# Patient Record
Sex: Female | Born: 1975 | Race: White | Hispanic: No | Marital: Married | Smoking: Never smoker
Health system: Southern US, Community
[De-identification: ages and names within clinical notes are randomized; demographics above are authoritative.]

## PROBLEM LIST (undated history)

## (undated) DIAGNOSIS — R609 Edema, unspecified: Secondary | ICD-10-CM

## (undated) DIAGNOSIS — G43909 Migraine, unspecified, not intractable, without status migrainosus: Secondary | ICD-10-CM

## (undated) HISTORY — PX: ABDOMINAL HYSTERECTOMY: SHX81

---

## 2017-05-14 ENCOUNTER — Emergency Department
Admission: EM | Admit: 2017-05-14 | Discharge: 2017-05-14 | Disposition: A | Discharge status: Home / Self Care | Payer: Medicaid Other | Source: Home / Self Care | Attending: Family Medicine | Admitting: Family Medicine

## 2017-05-14 ENCOUNTER — Encounter: Payer: Self-pay | Admitting: Emergency Medicine

## 2017-05-14 DIAGNOSIS — R197 Diarrhea, unspecified: Secondary | ICD-10-CM

## 2017-05-14 DIAGNOSIS — R110 Nausea: Secondary | ICD-10-CM

## 2017-05-14 LAB — POCT CBC W AUTO DIFF (K'VILLE URGENT CARE)

## 2017-05-14 MED ORDER — ONDANSETRON 4 MG PO TBDP
4.0000 mg | ORAL_TABLET | Freq: Once | ORAL | Status: AC
  Administered 2017-05-14: 4 mg via ORAL

## 2017-05-14 MED ORDER — ONDANSETRON 4 MG PO TBDP: ORAL_TABLET | ORAL | 0 refills | Status: DC

## 2017-05-14 NOTE — Discharge Instructions (Signed)

## 2017-05-14 NOTE — ED Triage Notes (Signed)
Diarrhea x 3.5 days, fever

## 2017-05-14 NOTE — ED Provider Notes (Signed)
[facility]    CSN: [id number] Arrival date & time: 05/14/17  1013     History   Chief Complaint Chief Complaint  Patient presents with  . Diarrhea    HPI Kathleen Goodman is a 40 y.o. female.   Four days ago patient developed diarrhea, abdominal cramping, and nausea without vomiting.  She had low grade fever two days ago.  Denies recent foreign travel, or drinking untreated water in a wilderness environment.  She denies recent antibiotic use.    The history is provided by the patient.  Diarrhea  Quality:  Watery Severity:  Mild Onset quality:  Sudden Duration:  4 days Timing:  Intermittent Progression:  Unchanged Relieved by:  Nothing Exacerbated by: food. Ineffective treatments:  None tried Associated symptoms: chills and fever   Associated symptoms: no abdominal pain, no arthralgias, no recent cough, no headaches, no myalgias, no URI and no vomiting   Risk factors: no recent antibiotic use, no sick contacts, no suspicious food intake and no travel to endemic areas     History reviewed. No pertinent past medical history.  There are no active problems to display for this patient.   Past Surgical History:  Procedure Laterality Date  . ABDOMINAL HYSTERECTOMY      OB History    No data available       Home Medications    Prior to Admission medications   Medication Sig Start Date End Date Taking? Authorizing Provider  furosemide (LASIX) 20 MG tablet Take 20 mg by mouth.   Yes [provider]  ondansetron (ZOFRAN ODT) 4 MG disintegrating tablet Take one tab by mouth Q6hr prn nausea.  Dissolve under tongue. 05/14/17   Lattie Haw, MD    Family History Family History  Problem Relation Age of Onset  . Hypertension Mother   . Diabetes Mother   . Hypertension Father   . Diabetes Father     Social History Social History  Substance Use Topics  . Smoking status: Never Smoker  . Smokeless tobacco: Never Used  . Alcohol use No      Allergies   Patient has no known allergies.   Review of Systems Review of Systems  Constitutional: Positive for chills and fever.  Gastrointestinal: Positive for diarrhea. Negative for abdominal pain and vomiting.  Musculoskeletal: Negative for arthralgias and myalgias.  Neurological: Negative for headaches.  All other systems reviewed and are negative.    Physical Exam Triage Vital Signs ED Triage Vitals  Enc Vitals Group     BP 05/14/17 1038 131/85     Pulse Rate 05/14/17 1038 (!) 117     Resp --      Temp 05/14/17 1038 99.1 F (37.3 C)     Temp Source 05/14/17 1038 Oral     SpO2 05/14/17 1038 98 %     Weight 05/14/17 1039 192 lb (87.1 kg)     Height 05/14/17 1039 5\' 5"  (1.651 m)     Head Circumference --      Peak Flow --      Pain Score 05/14/17 1039 3     Pain Loc --      Pain Edu? --      Excl. in GC? --    No data found.   Updated Vital Signs BP 131/85 (BP Location: Left Arm)   Pulse (!) 117   Temp 99.1 F (37.3 C) (Oral)   Ht 5\' 5"  (1.651 m)   Wt 192 lb (87.1 kg)  SpO2 98%   BMI 31.95 kg/m   Visual Acuity Right Eye Distance:   Left Eye Distance:   Bilateral Distance:    Right Eye Near:   Left Eye Near:    Bilateral Near:     Physical Exam Nursing notes and Vital Signs reviewed. Appearance:  Patient appears stated age, and in no acute distress Eyes:  Pupils are equal, round, and reactive to light and accomodation.  Extraocular movement is intact.  Conjunctivae are not inflamed  Ears:  Canals normal.  Tympanic membranes normal.  Nose:   Normal turbinates.  No sinus tenderness.    Mouth/Pharynx:  Normal; moist mucous membranes  Neck:  Supple.  No adenopathy Lungs:  Clear to auscultation.  Breath sounds are equal.  Moving air well. Heart:  Regular rate and rhythm without murmurs, rubs, or gallops.  Abdomen:  Nontender without masses or hepatosplenomegaly.  Bowel sounds are present.  No CVA or flank tenderness.  Extremities:  No edema.   Skin:  No rash present.    UC Treatments / Results  Labs (all labs ordered are listed, but only abnormal results are displayed) Labs Reviewed -   WBC 9.3; LY 20.7; MO 1.8; GR 77.5; Hgb 15.2; Platelets 327    EKG  EKG Interpretation None       Radiology No results found.  Procedures Procedures (including critical care time)  Medications Ordered in UC Medications  ondansetron (ZOFRAN-ODT) disintegrating tablet 4 mg (not administered)     Initial Impression / Assessment and Plan / UC Course  I have reviewed the triage vital signs and the nursing notes.  Pertinent labs & imaging results that were available during my care of the patient were reviewed by me and considered in my medical decision making (see chart for details).    Normal WBC reassuring. Suspect viral gastroenteritis. Administered Zofran ODT 4mg  PO;  Given Rx for same. Begin clear liquids (Pedialyte while having diarrhea) until improved, then advance to a SU[employer]BR[employer] diet (Bananas, Rice, Applesauce, Toast).  Then gradually resume a regular diet when tolerated.  Avoid milk products until well.  To decrease diarrhea, mix one teaspoon Citrucel (methylcellulose) in 2 oz water and drink one to three times daily.  Do not drink extra fluids with this dose and do not drink fluids for one hour afterwards.  When stools become more formed, may take Imodium (loperamide) once or twice daily to decrease stool frequency.  If symptoms become significantly worse during the night or over the weekend, proceed to the local emergency room.     Final Clinical Impressions(s) / UC Diagnoses   Final diagnoses:  Nausea  Diarrhea of presumed infectious origin    New Prescriptions New Prescriptions   ONDANSETRON (ZOFRAN ODT) 4 MG DISINTEGRATING TABLET    Take one tab by mouth Q6hr prn nausea.  Dissolve under tongue.     Lattie HawBeese, Laird Runnion A, MD 05/23/17 [phone]

## 2017-05-23 ENCOUNTER — Telehealth (INDEPENDENT_AMBULATORY_CARE_PROVIDER_SITE_OTHER): Payer: Medicaid Other | Admitting: Family Medicine

## 2017-05-23 DIAGNOSIS — R110 Nausea: Secondary | ICD-10-CM

## 2017-05-23 DIAGNOSIS — R197 Diarrhea, unspecified: Secondary | ICD-10-CM

## 2017-05-23 NOTE — Telephone Encounter (Signed)
ENCOUNTER CREATED TO ADD CBC ORDER NOT ENTERED ON DOS 05/14/17.

## 2018-04-27 ENCOUNTER — Other Ambulatory Visit: Payer: Self-pay

## 2018-04-27 ENCOUNTER — Encounter: Payer: Self-pay | Admitting: Emergency Medicine

## 2018-04-27 ENCOUNTER — Emergency Department
Admission: EM | Admit: 2018-04-27 | Discharge: 2018-04-27 | Disposition: A | Discharge status: Home / Self Care | Payer: Medicaid Other | Source: Home / Self Care

## 2018-04-27 DIAGNOSIS — R21 Rash and other nonspecific skin eruption: Secondary | ICD-10-CM

## 2018-04-27 MED ORDER — PREDNISONE 10 MG PO TABS: ORAL_TABLET | ORAL | 0 refills | Status: DC

## 2018-04-27 NOTE — ED Triage Notes (Signed)
Pt c/o of rash that started last Wednesday. States it started on her abdomen and has spread to her legs and neck. There is redness and itching.

## 2018-04-27 NOTE — Discharge Instructions (Addendum)
Return if any problems.  See your Physician for recheck in 3-4 days  ?

## 2018-04-28 NOTE — ED Provider Notes (Signed)
[facility]    CSN: [id number] Arrival date & time: 04/27/18  1229     History   Chief Complaint Chief Complaint  Patient presents with  . Rash    HPI Kathleen Goodman is a 42 y.o. female.   The history is provided by the patient. No language interpreter was used.  Rash  Location:  Full body Quality: itchiness and redness   Severity:  Moderate Onset quality:  Gradual Duration:  1 week Timing:  Constant Progression:  Worsening Chronicity:  New Context: animal contact   Relieved by:  None tried Worsened by:  Nothing Associated symptoms: no fever, no headaches, no joint pain, no myalgias and no sore throat     History reviewed. No pertinent past medical history.  There are no active problems to display for this patient.   Past Surgical History:  Procedure Laterality Date  . ABDOMINAL HYSTERECTOMY      OB History   None      Home Medications    Prior to Admission medications   Medication Sig Start Date End Date Taking? Authorizing Provider  furosemide (LASIX) 20 MG tablet Take 20 mg by mouth.    [provider]  ondansetron (ZOFRAN ODT) 4 MG disintegrating tablet Take one tab by mouth Q6hr prn nausea.  Dissolve under tongue. 05/14/17   Lattie Haw, MD  predniSONE (DELTASONE) 10 MG tablet 6,5,4,3,2,1 taper 04/27/18   Kathleen Areas, PA-C    Family History Family History  Problem Relation Age of Onset  . Hypertension Mother   . Diabetes Mother   . Hypertension Father   . Diabetes Father     Social History Social History   Tobacco Use  . Smoking status: Never Smoker  . Smokeless tobacco: Never Used  Substance Use Topics  . Alcohol use: No  . Drug use: No     Allergies   Patient has no known allergies.   Review of Systems Review of Systems  Constitutional: Negative for fever.  HENT: Negative for sore throat.   Musculoskeletal: Negative for arthralgias and myalgias.  Skin: Positive for rash.  Neurological:  Negative for headaches.  All other systems reviewed and are negative.    Physical Exam Triage Vital Signs ED Triage Vitals  Enc Vitals Group     BP 04/27/18 1253 (!) 136/95     Pulse Rate 04/27/18 1253 (!) 103     Resp --      Temp 04/27/18 1253 98.3 F (36.8 C)     Temp Source 04/27/18 1253 Oral     SpO2 --      Weight 04/27/18 1254 211 lb (95.7 kg)     Height 04/27/18 1254  (1.651 m)     Head Circumference --      Peak Flow --      Pain Score 04/27/18 1254 0     Pain Loc --      Pain Edu? --      Excl. in GC? --    No data found.  Updated Vital Signs BP (!) 136/95 (BP Location: Right Arm)   Pulse (!) 103   Temp 98.3 F (36.8 C) (Oral)   Ht  (1.651 m)   Wt 211 lb (95.7 kg)   BMI 35.11 kg/m   Visual Acuity Right Eye Distance:   Left Eye Distance:   Bilateral Distance:    Right Eye Near:   Left Eye Near:    Bilateral Near:  Physical Exam  Constitutional: She is oriented to person, place, and time. She appears well-developed and well-nourished.  HENT:  Head: Normocephalic.  Eyes: EOM are normal.  Neck: Normal range of motion.  Cardiovascular: Normal rate.  Pulmonary/Chest: Effort normal.  Abdominal: She exhibits no distension.  Musculoskeletal: Normal range of motion.  Neurological: She is alert and oriented to person, place, and time.  Skin:  Red raised Goodman, looks like bites,   Psychiatric: She has a normal mood and affect.  Nursing note and vitals reviewed.    UC Treatments / Results  Labs (all labs ordered are listed, but only abnormal results are displayed) Labs Reviewed - No data to display  EKG None  Radiology No results found.  Procedures Procedures (including critical care time)  Medications Ordered in UC Medications - No data to display  Initial Impression / Assessment and Plan / UC Course  I have reviewed the triage vital signs and the nursing notes.  Pertinent labs & imaging results that were available during  my care of the patient were reviewed by me and considered in my medical decision making (see chart for details).     MDM  Pt advised to check for bedbugs.   Possible chiggers.   I will treat with prednisone  Final Clinical Impressions(s) / UC Diagnoses   Final diagnoses:  Rash     Discharge Instructions     Return if any problems.  See your Physician for recheck in 3-4 days   ED Prescriptions    Medication Sig Dispense Auth. Provider   predniSONE (DELTASONE) 10 MG tablet 6,5,4,3,2,1 taper 21 tablet Kathleen Goodman, [place]     Controlled Substance Prescriptions [place] Controlled Substance Registry consulted? Not Applicable   Kathleen Goodman, [place] 04/28/18 1002

## 2018-10-16 ENCOUNTER — Other Ambulatory Visit: Payer: Self-pay

## 2018-10-16 ENCOUNTER — Emergency Department
Admission: EM | Admit: 2018-10-16 | Discharge: 2018-10-16 | Disposition: A | Discharge status: Home / Self Care | Payer: Self-pay | Source: Home / Self Care | Attending: Family Medicine | Admitting: Family Medicine

## 2018-10-16 DIAGNOSIS — H8111 Benign paroxysmal vertigo, right ear: Secondary | ICD-10-CM

## 2018-10-16 MED ORDER — MECLIZINE HCL 25 MG PO TABS: 25.0000 mg | ORAL_TABLET | Freq: Three times a day (TID) | ORAL | 0 refills | Status: DC | PRN

## 2018-10-16 NOTE — ED Provider Notes (Signed)
[facility]    CSN: [id number] Arrival date & time: 10/16/18  4098     History   Chief Complaint Chief Complaint  Patient presents with  . Dizziness    sudden onset    HPI Kathleen Goodman is a 42 y.o. female.   HPI Kathleen Goodman is a 42 y.o. female presenting to UC with c/o sudden onset dizziness, had to hold onto the wall while getting out of bed to walk to the bathroom. Associated nausea. The nausea has eased up but the dizziness has persisted.  No prior hx of vertigo. Hx of migraines but denies HA today. Denies recent illness, cough, congestion, vomiting or diarrhea. No increased stress.    History reviewed. No pertinent past medical history.  There are no active problems to display for this patient.   Past Surgical History:  Procedure Laterality Date  . ABDOMINAL HYSTERECTOMY      OB History   None      Home Medications    Prior to Admission medications   Medication Sig Start Date End Date Taking? Authorizing Provider  furosemide (LASIX) 20 MG tablet Take 20 mg by mouth.    [provider]  meclizine (ANTIVERT) 25 MG tablet Take 1 tablet (25 mg total) by mouth 3 (three) times daily as needed for dizziness. 10/16/18   Lurene Shadow, PA-C  ondansetron (ZOFRAN ODT) 4 MG disintegrating tablet Take one tab by mouth Q6hr prn nausea.  Dissolve under tongue. 05/14/17   Lattie Haw, MD  predniSONE (DELTASONE) 10 MG tablet 6,5,4,3,2,1 taper 04/27/18   Elson Areas, PA-C    Family History Family History  Problem Relation Age of Onset  . Hypertension Mother   . Diabetes Mother   . Hypertension Father   . Diabetes Father     Social History Social History   Tobacco Use  . Smoking status: Never Smoker  . Smokeless tobacco: Never Used  Substance Use Topics  . Alcohol use: No  . Drug use: No     Allergies   Codeine   Review of Systems Review of Systems  Constitutional: Negative for chills and fever.  Cardiovascular:  Negative for chest pain, palpitations and leg swelling.  Gastrointestinal: Positive for nausea. Negative for vomiting.  Musculoskeletal: Negative for arthralgias, neck pain and neck stiffness.  Neurological: Positive for dizziness. Negative for speech difficulty, weakness, light-headedness, numbness and headaches.     Physical Exam Triage Vital Signs ED Triage Vitals [10/16/18 0955]  Enc Vitals Group     BP (!) 135/95     Pulse Rate 80     Resp      Temp (!) 97.4 F (36.3 C)     Temp Source Oral     SpO2 98 %     Weight 192 lb (87.1 kg)     Height 5\' 6"  (1.676 m)     Head Circumference      Peak Flow      Pain Score 0     Pain Loc      Pain Edu?      Excl. in GC?    No data found.  Updated Vital Signs BP (!) 135/95 (BP Location: Right Arm)   Pulse 80   Temp (!) 97.4 F (36.3 C) (Oral)   Ht 5\' 6"  (1.676 m)   Wt 192 lb (87.1 kg)   SpO2 98%   BMI 30.99 kg/m   Visual Acuity Right Eye Distance:   Left Eye Distance:  Bilateral Distance:    Right Eye Near:   Left Eye Near:    Bilateral Near:     Physical Exam  Constitutional: She is oriented to person, place, and time. She appears well-developed and well-nourished.  HENT:  Head: Normocephalic and atraumatic.  Right Ear: Tympanic membrane normal.  Left Ear: Tympanic membrane normal.  Nose: Nose normal. Right sinus exhibits no maxillary sinus tenderness and no frontal sinus tenderness. Left sinus exhibits no maxillary sinus tenderness and no frontal sinus tenderness.  Mouth/Throat: Uvula is midline, oropharynx is clear and moist and mucous membranes are normal.  Eyes: Pupils are equal, round, and reactive to light. Conjunctivae are normal. Right eye exhibits no discharge. Left eye exhibits no discharge. Right eye exhibits nystagmus. Left eye exhibits nystagmus.  Neck: Normal range of motion. Neck supple.  Cardiovascular: Normal rate and regular rhythm.  Pulmonary/Chest: Effort normal and breath sounds normal. No  respiratory distress.  Musculoskeletal: Normal range of motion.  Neurological: She is alert and oriented to person, place, and time.  Speech is clear, face is symmetric. Alert to person, place and time. Normal finger to nose coordination. Normal gait.   Skin: Skin is warm and dry. Capillary refill takes less than 2 seconds.  Psychiatric: She has a normal mood and affect. Her behavior is normal.  Nursing note and vitals reviewed.    UC Treatments / Results  Labs (all labs ordered are listed, but only abnormal results are displayed) Labs Reviewed - No data to display  EKG None  Radiology No results found.  Procedures Procedures (including critical care time)  Medications Ordered in UC Medications - No data to display  Initial Impression / Assessment and Plan / UC Course  I have reviewed the triage vital signs and the nursing notes.  Pertinent labs & imaging results that were available during my care of the patient were reviewed by me and considered in my medical decision making (see chart for details).     Normal neuro exam besides horizontal nystagmus to the Right. Will tx for vertigo  Home care instructions provided. Encouraged f/u with PCP. Discussed symptoms that warrant emergent care in the ED.  Final Clinical Impressions(s) / UC Diagnoses   Final diagnoses:  BPPV (benign paroxysmal positional vertigo), right     Discharge Instructions      Antivert (meclizine) is a medication to help with dizziness and nausea related to vertigo.  This medication can cause drowsiness. Do not operate heavy machinery or drive while taking.   Please follow up with family medicine in 2-3 days if not improving.  Call 911 or have someone drive you to the closest hospital if symptoms worsening/new symptoms develop- increased dizziness despite taking the medication, headache, change in vision, vomiting, weakness/numbness, or other new concerning symptoms develop.     ED  Prescriptions    Medication Sig Dispense Auth. Provider   meclizine (ANTIVERT) 25 MG tablet Take 1 tablet (25 mg total) by mouth 3 (three) times daily as needed for dizziness. 30 tablet Lurene Shadow, PA-C     Controlled Substance Prescriptions [place] Controlled Substance Registry consulted? Not Applicable   Rolla Plate 10/17/18 1151

## 2018-10-16 NOTE — ED Triage Notes (Signed)
Pt woke up this morning extremely dizzy. Hardly could get out of bed. Some nausea due to the dizziness. States it has eased up as of 30 mins ago but still continues to feel dizzy.

## 2018-10-16 NOTE — Discharge Instructions (Signed)
°  Antivert (meclizine) is a medication to help with dizziness and nausea related to vertigo.  This medication can cause drowsiness. Do not operate heavy machinery or drive while taking.   Please follow up with family medicine in 2-3 days if not improving.  Call 911 or have someone drive you to the closest hospital if symptoms worsening/new symptoms develop- increased dizziness despite taking the medication, headache, change in vision, vomiting, weakness/numbness, or other new concerning symptoms develop.

## 2019-03-15 ENCOUNTER — Other Ambulatory Visit: Payer: Self-pay

## 2019-03-15 ENCOUNTER — Telehealth: Payer: Self-pay | Admitting: Emergency Medicine

## 2019-03-15 ENCOUNTER — Emergency Department (INDEPENDENT_AMBULATORY_CARE_PROVIDER_SITE_OTHER): Payer: PRIVATE HEALTH INSURANCE

## 2019-03-15 ENCOUNTER — Emergency Department (INDEPENDENT_AMBULATORY_CARE_PROVIDER_SITE_OTHER)
Admission: EM | Admit: 2019-03-15 | Discharge: 2019-03-15 | Disposition: A | Discharge status: Home / Self Care | Payer: PRIVATE HEALTH INSURANCE | Source: Home / Self Care

## 2019-03-15 ENCOUNTER — Telehealth: Payer: Self-pay

## 2019-03-15 ENCOUNTER — Encounter: Payer: Self-pay | Admitting: Emergency Medicine

## 2019-03-15 DIAGNOSIS — R05 Cough: Secondary | ICD-10-CM

## 2019-03-15 DIAGNOSIS — B9789 Other viral agents as the cause of diseases classified elsewhere: Secondary | ICD-10-CM

## 2019-03-15 DIAGNOSIS — J069 Acute upper respiratory infection, unspecified: Secondary | ICD-10-CM

## 2019-03-15 DIAGNOSIS — J029 Acute pharyngitis, unspecified: Secondary | ICD-10-CM

## 2019-03-15 LAB — POCT INFLUENZA A/B
Influenza A, POC: NEGATIVE
Influenza B, POC: NEGATIVE

## 2019-03-15 LAB — POCT RAPID STREP A (OFFICE): Rapid Strep A Screen: NEGATIVE

## 2019-03-15 MED ORDER — BENZONATATE 100 MG PO CAPS: 100.0000 mg | ORAL_CAPSULE | Freq: Three times a day (TID) | ORAL | 0 refills | Status: DC

## 2019-03-15 MED ORDER — ALBUTEROL SULFATE HFA 108 (90 BASE) MCG/ACT IN AERS: 1.0000 | INHALATION_SPRAY | Freq: Four times a day (QID) | RESPIRATORY_TRACT | 0 refills | Status: DC | PRN

## 2019-03-15 NOTE — ED Triage Notes (Signed)
Sore throat, fever, chills, loss of appetite, fatigue, chest heaviness x 4 days

## 2019-03-15 NOTE — ED Provider Notes (Signed)
[facility]    CSN: [id number] Arrival date & time: 03/15/19  0949     History   Chief Complaint Chief Complaint  Patient presents with  . Sore Throat    HPI Kathleen Goodman is a 43 y.o. female.   HPI  Kathleen Goodman is a 43 y.o. female presenting to UC with c/o sore throat, fever, chills, loss of appetite, fatigue and chest heaviness for 4 days. Sore throat is most bothersome.  Denies n/v/d. No SOB. No hx of asthma but she was hospitalized for pneumonia about 20 years ago. No recent travel or sick contacts.    History reviewed. No pertinent past medical history.  There are no active problems to display for this patient.   Past Surgical History:  Procedure Laterality Date  . ABDOMINAL HYSTERECTOMY      OB History   No obstetric history on file.      Home Medications    Prior to Admission medications   Medication Sig Start Date End Date Taking? Authorizing Provider  SUMAtriptan (IMITREX) 100 MG tablet Take 100 mg by mouth every 2 (two) hours as needed for migraine. May repeat in 2 hours if headache persists or recurs.   Yes [provider]  albuterol (PROVENTIL HFA;VENTOLIN HFA) 108 (90 Base) MCG/ACT inhaler Inhale 1-2 puffs into the lungs every 6 (six) hours as needed for wheezing or shortness of breath. 03/15/19   Lurene Shadow, PA-C  benzonatate (TESSALON) 100 MG capsule Take 1-2 capsules (100-200 mg total) by mouth every 8 (eight) hours. 03/15/19   Lurene Shadow, PA-C  furosemide (LASIX) 20 MG tablet Take 20 mg by mouth.    [provider]    Family History Family History  Problem Relation Age of Onset  . Hypertension Mother   . Diabetes Mother   . Hypertension Father   . Diabetes Father     Social History Social History   Tobacco Use  . Smoking status: Never Smoker  . Smokeless tobacco: Never Used  Substance Use Topics  . Alcohol use: No  . Drug use: No     Allergies   Codeine   Review of Systems Review of  Systems  Constitutional: Positive for chills, fatigue and fever.  HENT: Positive for congestion and sore throat. Negative for ear pain, trouble swallowing and voice change.   Respiratory: Positive for cough. Negative for shortness of breath.   Cardiovascular: Negative for chest pain and palpitations.  Gastrointestinal: Negative for abdominal pain, diarrhea, nausea and vomiting.  Musculoskeletal: Positive for arthralgias, back pain and myalgias.  Skin: Negative for rash.     Physical Exam Triage Vital Signs ED Triage Vitals [03/15/19 1023]  Enc Vitals Group     BP 131/90     Pulse Rate 91     Resp      Temp 97.9 F (36.6 C)     Temp Source Oral     SpO2 99 %     Weight 175 lb (79.4 kg)     Height 5\' 6"  (1.676 m)     Head Circumference      Peak Flow      Pain Score 4     Pain Loc      Pain Edu?      Excl. in GC?    No data found.  Updated Vital Signs BP 131/90 (BP Location: Right Arm)   Pulse 91   Temp 97.9 F (36.6 C) (Oral)   Ht 5\' 6"  (1.676  m)   Wt 175 lb (79.4 kg)   SpO2 99%   BMI 28.25 kg/m   Visual Acuity Right Eye Distance:   Left Eye Distance:   Bilateral Distance:    Right Eye Near:   Left Eye Near:    Bilateral Near:     Physical Exam Vitals signs and nursing note reviewed.  Constitutional:      Appearance: She is well-developed.  HENT:     Head: Normocephalic and atraumatic.     Right Ear: Tympanic membrane normal.     Left Ear: Tympanic membrane normal.     Nose: Nose normal.     Right Sinus: No maxillary sinus tenderness or frontal sinus tenderness.     Left Sinus: No maxillary sinus tenderness or frontal sinus tenderness.     Mouth/Throat:     Lips: Pink.     Mouth: Mucous membranes are moist.     Pharynx: Oropharynx is clear. Uvula midline.  Neck:     Musculoskeletal: Normal range of motion.  Cardiovascular:     Rate and Rhythm: Normal rate and regular rhythm.  Pulmonary:     Effort: Pulmonary effort is normal. No respiratory  distress.     Breath sounds: Normal breath sounds. No stridor. No wheezing or rhonchi.  Musculoskeletal: Normal range of motion.  Skin:    General: Skin is warm and dry.  Neurological:     Mental Status: She is alert and oriented to person, place, and time.  Psychiatric:        Behavior: Behavior normal.      UC Treatments / Results  Labs (all labs ordered are listed, but only abnormal results are displayed) Labs Reviewed  POCT RAPID STREP A (OFFICE)  POCT INFLUENZA A/B    EKG None  Radiology Dg Chest 2 View  Result Date: 03/15/2019 CLINICAL DATA:  Sore throat and cough for several days EXAM: CHEST - 2 VIEW COMPARISON:  None. FINDINGS: The heart size and mediastinal contours are within normal limits. Both lungs are clear. The visualized skeletal structures are unremarkable. IMPRESSION: No active cardiopulmonary disease. Electronically Signed   By: Alcide Clever M.D.   On: 03/15/2019 10:57    Procedures Procedures (including critical care time)  Medications Ordered in UC Medications - No data to display  Initial Impression / Assessment and Plan / UC Course  I have reviewed the triage vital signs and the nursing notes.  Pertinent labs & imaging results that were available during my care of the patient were reviewed by me and considered in my medical decision making (see chart for details).     Hx and exam c/w viral illness Rapid strep: NEGATIVE Culture sent Encouraged symptomatic tx  Final Clinical Impressions(s) / UC Diagnoses   Final diagnoses:  Acute pharyngitis, unspecified etiology  Viral URI with cough     Discharge Instructions      You may take  acetaminophen every 4-6 hours or in combination with ibuprofen 400-600mg  every 6-8 hours as needed for pain, inflammation, and fever.  Be sure to well hydrated with clear liquids and get at least 8 hours of sleep at night, preferably more while sick.   Please follow up with family medicine in 1 week if  needed.     ED Prescriptions    Medication Sig Dispense Auth. Provider   benzonatate (TESSALON) 100 MG capsule Take 1-2 capsules (100-200 mg total) by mouth every 8 (eight) hours. 21 capsule Waylan Rocher O, PA-C   albuterol (PROVENTIL  HFA;VENTOLIN HFA) 108 (90 Base) MCG/ACT inhaler Inhale 1-2 puffs into the lungs every 6 (six) hours as needed for wheezing or shortness of breath. 1 Inhaler Lurene Shadow, PA-C     Controlled Substance Prescriptions [place] Controlled Substance Registry consulted? Not Applicable   Rolla Plate 03/15/19 1150

## 2019-03-15 NOTE — Discharge Instructions (Addendum)
  You may take 500mg acetaminophen every 4-6 hours or in combination with ibuprofen 400-600mg every 6-8 hours as needed for pain, inflammation, and fever.  Be sure to well hydrated with clear liquids and get at least 8 hours of sleep at night, preferably more while sick.   Please follow up with family medicine in 1 week if needed.   

## 2019-03-15 NOTE — Telephone Encounter (Signed)
Pt called and stated that [facility] does not have Tessalon, sent to [facility].

## 2019-03-17 ENCOUNTER — Telehealth: Payer: Self-pay | Admitting: *Deleted

## 2019-03-17 LAB — CULTURE, GROUP A STREP
MICRO NUMBER:: 324942
SPECIMEN QUALITY:: ADEQUATE

## 2019-03-17 NOTE — Telephone Encounter (Signed)
Spoke to pt given Tcx results. To call back if she has any questions or concerns.

## 2019-12-27 ENCOUNTER — Encounter: Payer: Self-pay | Admitting: Family Medicine

## 2019-12-27 ENCOUNTER — Emergency Department
Admission: EM | Admit: 2019-12-27 | Discharge: 2019-12-27 | Disposition: A | Discharge status: Home / Self Care | Payer: PRIVATE HEALTH INSURANCE | Source: Home / Self Care

## 2019-12-27 ENCOUNTER — Other Ambulatory Visit: Payer: Self-pay

## 2019-12-27 DIAGNOSIS — G43911 Migraine, unspecified, intractable, with status migrainosus: Secondary | ICD-10-CM | POA: Diagnosis not present

## 2019-12-27 MED ORDER — KETOROLAC TROMETHAMINE 60 MG/2ML IM SOLN
60.0000 mg | Freq: Once | INTRAMUSCULAR | Status: AC
  Administered 2019-12-27: 11:00:00 60 mg via INTRAMUSCULAR

## 2019-12-27 MED ORDER — DEXAMETHASONE SODIUM PHOSPHATE 10 MG/ML IJ SOLN
10.0000 mg | Freq: Once | INTRAMUSCULAR | Status: AC
  Administered 2019-12-27: 11:00:00 10 mg via INTRAMUSCULAR

## 2019-12-27 NOTE — Discharge Instructions (Addendum)
Vitamin D3 5000 IU (125 mg) daily Vitamin C 500 mg twice daily Zinc 50 to 75 mg daily   

## 2019-12-27 NOTE — ED Triage Notes (Signed)
Pt c/o migraine that started yesterday about 2 oclock. Pt says chocolate triggers them and had a small piece of chocolate yesterday. Pain 7/10. Usually gets toradol injection at PCP when this happens. Has taken 3 rizatriptan since yesterday with no relief.

## 2019-12-27 NOTE — ED Provider Notes (Signed)
[facility]    CSN: [id number] Arrival date & time: 12/27/19  5361      History   Chief Complaint Chief Complaint  Patient presents with  . Migraine    HPI Meloni Hinz is a 43 y.o. female.   Is a 43 year old established [facility] urgent [facility] patient who presents with headache.  Pt c/o migraine that started yesterday about 2 oclock. Pt says chocolate triggers them and had a small piece of chocolate yesterday. Pain 7/10. Usually gets toradol injection at PCP when this happens. Has taken 3 rizatriptan since yesterday with no relief.   This is patient's first migraine in 18 months.       History reviewed. No pertinent past medical history.  There are no problems to display for this patient.   Past Surgical History:  Procedure Laterality Date  . ABDOMINAL HYSTERECTOMY      OB History   No obstetric history on file.      Home Medications    Prior to Admission medications   Medication Sig Start Date End Date Taking? Authorizing Provider  albuterol (PROVENTIL HFA;VENTOLIN HFA) 108 (90 Base) MCG/ACT inhaler Inhale 1-2 puffs into the lungs every 6 (six) hours as needed for wheezing or shortness of breath. 03/15/19   Lurene Shadow, PA-C  benzonatate (TESSALON) 100 MG capsule Take 1-2 capsules (100-200 mg total) by mouth every 8 (eight) hours. 03/15/19   Lurene Shadow, PA-C  furosemide (LASIX) 20 MG tablet Take 20 mg by mouth.    [provider]  SUMAtriptan (IMITREX) 100 MG tablet Take 100 mg by mouth every 2 (two) hours as needed for migraine. May repeat in 2 hours if headache persists or recurs.    [provider]    Family History Family History  Problem Relation Age of Onset  . Hypertension Mother   . Diabetes Mother   . Hypertension Father   . Diabetes Father     Social History Social History   Tobacco Use  . Smoking status: Never Smoker  . Smokeless tobacco: Never Used  Substance Use Topics  . Alcohol use: No  .  Drug use: No     Allergies   Codeine   Review of Systems Review of Systems   Physical Exam Triage Vital Signs ED Triage Vitals  Enc Vitals Group     BP      Pulse      Resp      Temp      Temp src      SpO2      Weight      Height      Head Circumference      Peak Flow      Pain Score      Pain Loc      Pain Edu?      Excl. in GC?    No data found.  Updated Vital Signs BP (!) 142/107 (BP Location: Right Arm)   Pulse 100   Temp 98.2 F (36.8 C) (Oral)   Resp 18   Ht 5\' 5"  (1.651 m)   SpO2 99%   BMI 29.12 kg/m    Physical Exam Vitals and nursing note reviewed.  Constitutional:      Appearance: Normal appearance.  HENT:     Head: Normocephalic.     Mouth/Throat:     Mouth: Mucous membranes are moist.  Eyes:     Conjunctiva/sclera: Conjunctivae normal.  Cardiovascular:     Rate and Rhythm:  Normal rate.  Pulmonary:     Effort: Pulmonary effort is normal.  Musculoskeletal:        General: Normal range of motion.     Cervical back: Normal range of motion and neck supple.  Skin:    General: Skin is warm.  Neurological:     General: No focal deficit present.     Mental Status: She is alert.  Psychiatric:        Mood and Affect: Mood normal.      UC Treatments / Results  Labs (all labs ordered are listed, but only abnormal results are displayed) Labs Reviewed - No data to display  EKG   Radiology No results found.  Procedures Procedures (including critical care time)  Medications Ordered in UC Medications  dexamethasone (DECADRON) injection 10 mg (has no administration in time range)  ketorolac (TORADOL) injection 60 mg (has no administration in time range)    Initial Impression / Assessment and Plan / UC Course  I have reviewed the triage vital signs and the nursing notes.  Pertinent labs & imaging results that were available during my care of the patient were reviewed by me and considered in my medical decision making (see chart  for details).    Final Clinical Impressions(s) / UC Diagnoses   Final diagnoses:  Intractable migraine with status migrainosus, unspecified migraine type     Discharge Instructions     Vitamin D3 5000 IU (125 mg) daily Vitamin C 500 mg twice daily Zinc 50 to 75 mg daily      ED Prescriptions    None     I have reviewed the PDMP during this encounter.   Robyn Haber, MD 12/27/19 1030

## 2020-08-11 ENCOUNTER — Other Ambulatory Visit: Payer: Self-pay

## 2020-08-11 ENCOUNTER — Emergency Department (INDEPENDENT_AMBULATORY_CARE_PROVIDER_SITE_OTHER)
Admission: EM | Admit: 2020-08-11 | Discharge: 2020-08-11 | Disposition: A | Discharge status: Home / Self Care | Payer: PRIVATE HEALTH INSURANCE | Source: Home / Self Care

## 2020-08-11 ENCOUNTER — Emergency Department (INDEPENDENT_AMBULATORY_CARE_PROVIDER_SITE_OTHER): Payer: PRIVATE HEALTH INSURANCE

## 2020-08-11 ENCOUNTER — Encounter: Payer: Self-pay | Admitting: Emergency Medicine

## 2020-08-11 DIAGNOSIS — R0789 Other chest pain: Secondary | ICD-10-CM

## 2020-08-11 DIAGNOSIS — R52 Pain, unspecified: Secondary | ICD-10-CM

## 2020-08-11 DIAGNOSIS — R5383 Other fatigue: Secondary | ICD-10-CM

## 2020-08-11 NOTE — Discharge Instructions (Signed)
  You may take 500mg  acetaminophen every 4-6 hours or in combination with ibuprofen 400-600mg  every 6-8 hours as needed for pain, inflammation, and fever.  Be sure to well hydrated with clear liquids and get at least 8 hours of sleep at night, preferably more while sick.   Please follow up with family medicine early next week for recheck of symptoms.  Call 911 or have someone drive you to the hospital if you develop worsening chest pain, trouble breathing, dizziness/passing out, or other new concerning symptoms develop.

## 2020-08-11 NOTE — ED Provider Notes (Signed)
[facility]    CSN: [id number] Arrival date & time: 08/11/20  1739      History   Chief Complaint Chief Complaint  Patient presents with  . Cough    HPI Kathleen Goodman is a 44 y.o. female.   HPI  Kathleen Goodman is a 45 y.o. female presenting to UC with c/o 6 days worsening cough, congestion, chest discomfort from cough, and fatigue. Symptoms started about 4 hours after received her first Covid-19 vaccine.  Pain is 5/10, aching. No known sick contacts or recent travel. No medication taken PTA.    History reviewed. No pertinent past medical history.  There are no problems to display for this patient.   Past Surgical History:  Procedure Laterality Date  . ABDOMINAL HYSTERECTOMY      OB History   No obstetric history on file.      Home Medications    Prior to Admission medications   Medication Sig Start Date End Date Taking? Authorizing Provider  zolpidem (AMBIEN) 5 MG tablet Take 5 mg by mouth at bedtime as needed for sleep.   Yes [provider]  furosemide (LASIX) 20 MG tablet Take 20 mg by mouth.    [provider]  SUMAtriptan (IMITREX) 100 MG tablet Take 100 mg by mouth every 2 (two) hours as needed for migraine. May repeat in 2 hours if headache persists or recurs.    [provider]    Family History Family History  Problem Relation Age of Onset  . Hypertension Mother   . Diabetes Mother   . Hypertension Father   . Diabetes Father     Social History Social History   Tobacco Use  . Smoking status: Never Smoker  . Smokeless tobacco: Never Used  Vaping Use  . Vaping Use: Never used  Substance Use Topics  . Alcohol use: No  . Drug use: No     Allergies   Codeine   Review of Systems Review of Systems  Constitutional: Positive for fatigue. Negative for chills and fever.  HENT: Positive for congestion. Negative for ear pain, sore throat, trouble swallowing and voice change.   Respiratory: Positive for  cough. Negative for shortness of breath.   Cardiovascular: Positive for chest pain. Negative for palpitations.  Gastrointestinal: Negative for abdominal pain, diarrhea, nausea and vomiting.  Musculoskeletal: Positive for arthralgias, back pain and myalgias.  Skin: Negative for rash.  All other systems reviewed and are negative.    Physical Exam Triage Vital Signs ED Triage Vitals  Enc Vitals Group     BP 08/11/20 1820 119/88     Pulse Rate 08/11/20 1820 93     Resp --      Temp 08/11/20 1820 98.6 F (37 C)     Temp Source 08/11/20 1820 Oral     SpO2 08/11/20 1820 99 %     Weight 08/11/20 1821 200 lb (90.7 kg)     Height 08/11/20 1821 5\' 6"  (1.676 m)     Head Circumference --      Peak Flow --      Pain Score 08/11/20 1821 5     Pain Loc --      Pain Edu? --      Excl. in GC? --    No data found.  Updated Vital Signs BP 119/88 (BP Location: Right Arm)   Pulse 93   Temp 98.6 F (37 C) (Oral)   Ht 5\' 6"  (1.676 m)   Wt 200 lb (  90.7 kg)   SpO2 99%   BMI 32.28 kg/m   Visual Acuity Right Eye Distance:   Left Eye Distance:   Bilateral Distance:    Right Eye Near:   Left Eye Near:    Bilateral Near:     Physical Exam Vitals and nursing note reviewed.  Constitutional:      General: She is not in acute distress.    Appearance: Normal appearance. She is well-developed. She is not ill-appearing, toxic-appearing or diaphoretic.  HENT:     Head: Normocephalic and atraumatic.     Right Ear: Tympanic membrane and ear canal normal.     Left Ear: Tympanic membrane and ear canal normal.     Nose: Nose normal.     Mouth/Throat:     Lips: Pink.     Mouth: Mucous membranes are moist.     Pharynx: Oropharynx is clear. Uvula midline.  Cardiovascular:     Rate and Rhythm: Normal rate and regular rhythm.  Pulmonary:     Effort: Pulmonary effort is normal. No respiratory distress.     Breath sounds: Normal breath sounds. No stridor. No wheezing, rhonchi or rales.  Chest:      Chest wall: No tenderness.  Musculoskeletal:        General: Normal range of motion.     Cervical back: Normal range of motion and neck supple.  Lymphadenopathy:     Cervical: No cervical adenopathy.  Skin:    General: Skin is warm and dry.  Neurological:     Mental Status: She is alert and oriented to person, place, and time.  Psychiatric:        Behavior: Behavior normal.      UC Treatments / Results  Labs (all labs ordered are listed, but only abnormal results are displayed) Labs Reviewed  SARS-COV-2 RNA,(COVID-19) QUALITATIVE NAAT    EKG Date/Time: 08/11/2020   19:37:17 Ventricular Rate: 82 PR Interval: 120 QRS Duration: 74 QT Interval: 384 QTC Calculation: 448 P-R-T axes: 16   14    16  Text Interpretation: Normal sinus rhythm. Normal EKG.    Radiology CLINICAL DATA:  44 year old female with chest tightness  EXAM: CHEST - 2 VIEW  COMPARISON:  Chest radiograph dated 03/15/2019.  FINDINGS: The heart size and mediastinal contours are within normal limits. Both lungs are clear. The visualized skeletal structures are unremarkable.  IMPRESSION: No active cardiopulmonary disease.   Electronically Signed   By: 03/17/2019 M.D.   On: 08/11/2020 19:14  Procedures Procedures (including critical care time)  Medications Ordered in UC Medications - No data to display  Initial Impression / Assessment and Plan / UC Course  I have reviewed the triage vital signs and the nursing notes.  Pertinent labs & imaging results that were available during my care of the patient were reviewed by me and considered in my medical decision making (see chart for details).     No evidence of bacterial infection at this time. Reassured pt of normal CXR and EKG Vitals: WNL Encouraged symptomatic tx F/u with PCP  AVS given  Final Clinical Impressions(s) / UC Diagnoses   Final diagnoses:  Body aches  Fatigue, unspecified type  Chest tightness      Discharge Instructions      You may take 500mg  acetaminophen every 4-6 hours or in combination with ibuprofen 400-600mg  every 6-8 hours as needed for pain, inflammation, and fever.  Be sure to well hydrated with clear liquids and get at least 8 hours of  sleep at night, preferably more while sick.   Please follow up with family medicine early next week for recheck of symptoms.  Call 911 or have someone drive you to the hospital if you develop worsening chest pain, trouble breathing, dizziness/passing out, or other new concerning symptoms develop.      ED Prescriptions    None     PDMP not reviewed this encounter.   Lurene Shadow, [place] 08/14/20 1253

## 2020-08-11 NOTE — ED Triage Notes (Signed)
Cough, fatigue, chest discomfort, x 6 days, 4 hours after getting first vaccination

## 2020-08-12 LAB — SARS-COV-2 RNA,(COVID-19) QUALITATIVE NAAT: SARS CoV2 RNA: NOT DETECTED

## 2021-01-24 ENCOUNTER — Emergency Department (INDEPENDENT_AMBULATORY_CARE_PROVIDER_SITE_OTHER)
Admission: EM | Admit: 2021-01-24 | Discharge: 2021-01-24 | Disposition: A | Discharge status: Home / Self Care | Payer: PRIVATE HEALTH INSURANCE | Source: Home / Self Care

## 2021-01-24 ENCOUNTER — Other Ambulatory Visit: Payer: Self-pay

## 2021-01-24 ENCOUNTER — Encounter: Payer: Self-pay | Admitting: *Deleted

## 2021-01-24 DIAGNOSIS — U071 COVID-19: Secondary | ICD-10-CM | POA: Diagnosis not present

## 2021-01-24 HISTORY — DX: Edema, unspecified: R60.9

## 2021-01-24 HISTORY — DX: Migraine, unspecified, not intractable, without status migrainosus: G43.909

## 2021-01-24 LAB — POC SARS CORONAVIRUS 2 AG -  ED: SARS Coronavirus 2 Ag: POSITIVE — AB

## 2021-01-24 NOTE — Discharge Instructions (Signed)
  You may take 500mg acetaminophen every 4-6 hours or in combination with ibuprofen 400-600mg every 6-8 hours as needed for pain, inflammation, and fever.  Be sure to well hydrated with clear liquids and get at least 8 hours of sleep at night, preferably more while sick.   Please follow up with family medicine in 1 week if needed.   

## 2021-01-24 NOTE — ED Triage Notes (Signed)
Pt c/o cough, fever, chills, sore throat and body aches, diarrhea x 2 days.  IBF @ 0730. Father in law with COVID. Home test positive last night. Needs test and note for work. Reports 2 COVID vaccines, last dose 08/26/20.

## 2021-01-24 NOTE — ED Provider Notes (Signed)
[facility]    CSN: [id number] Arrival date & time: 01/24/21  2841      History   Chief Complaint Chief Complaint  Patient presents with  . Cough  . Fever    HPI Kathleen Goodman is a 45 y.o. female.   HPI Kathleen Goodman is a 45 y.o. female presenting to UC with c/o cough, fever, chills, sore throat, body aches, fatigue and diarrhea for 2 days.  She took 800mg  ibuprofen at 7:30AM this morning.  Mild chest tightness but no SOB. She had two doses of COVID vaccine, last dose 08/26/20. Her FIL called for help this weekend due to felling bad, he was taken to the hospital where he tested positive for COVID and dx with pneumonia.  Pt took a COVID test at home last night, it was positive. Pt's work requesting a test and note.   Past Medical History:  Diagnosis Date  . Fluid retention   . Migraine     There are no problems to display for this patient.   Past Surgical History:  Procedure Laterality Date  . ABDOMINAL HYSTERECTOMY      OB History   No obstetric history on file.      Home Medications    Prior to Admission medications   Medication Sig Start Date End Date Taking? Authorizing Provider  furosemide (LASIX) 20 MG tablet Take 20 mg by mouth.    [provider]  SUMAtriptan (IMITREX) 100 MG tablet Take 100 mg by mouth every 2 (two) hours as needed for migraine. May repeat in 2 hours if headache persists or recurs.    [provider]  zolpidem (AMBIEN) 5 MG tablet Take 5 mg by mouth at bedtime as needed for sleep.    [provider]    Family History Family History  Problem Relation Age of Onset  . Hypertension Mother   . Diabetes Mother   . Hypertension Father   . Diabetes Father     Social History Social History   Tobacco Use  . Smoking status: Never Smoker  . Smokeless tobacco: Never Used  Vaping Use  . Vaping Use: Never used  Substance Use Topics  . Alcohol use: No  . Drug use: No     Allergies    Codeine   Review of Systems Review of Systems  Constitutional: Positive for chills, fatigue and fever.  HENT: Positive for congestion and sore throat. Negative for ear pain, trouble swallowing and voice change.   Respiratory: Positive for cough. Negative for shortness of breath.   Cardiovascular: Negative for chest pain and palpitations.  Gastrointestinal: Positive for diarrhea. Negative for abdominal pain, nausea and vomiting.  Musculoskeletal: Positive for arthralgias, back pain and myalgias.  Skin: Negative for rash.  Neurological: Positive for headaches. Negative for dizziness and light-headedness.  All other systems reviewed and are negative.    Physical Exam Triage Vital Signs ED Triage Vitals  Enc Vitals Group     BP 01/24/21 0830 131/86     Pulse Rate 01/24/21 0830 (!) 112     Resp 01/24/21 0830 18     Temp 01/24/21 0830 98.1 F (36.7 C)     Temp Source 01/24/21 0830 Oral     SpO2 01/24/21 0830 96 %     Weight 01/24/21 0828 220 lb (99.8 kg)     Height 01/24/21 0828 5\' 6"  (1.676 m)     Head Circumference --      Peak Flow --  Pain Score 01/24/21 0828 8     Pain Loc --      Pain Edu? --      Excl. in GC? --    No data found.  Updated Vital Signs BP 131/86 (BP Location: Right Arm)   Pulse (!) 112   Temp 98.1 F (36.7 C) (Oral)   Resp 18   Ht 5\' 6"  (1.676 m)   Wt 220 lb (99.8 kg)   SpO2 96%   BMI 35.51 kg/m   Visual Acuity Right Eye Distance:   Left Eye Distance:   Bilateral Distance:    Right Eye Near:   Left Eye Near:    Bilateral Near:     Physical Exam Vitals and nursing note reviewed.  Constitutional:      General: She is not in acute distress.    Appearance: Normal appearance. She is well-developed and well-nourished. She is not ill-appearing, toxic-appearing or diaphoretic.  HENT:     Head: Normocephalic and atraumatic.     Right Ear: Tympanic membrane and ear canal normal.     Left Ear: Tympanic membrane and ear canal normal.      Nose: Congestion present.     Mouth/Throat:     Lips: Pink.     Mouth: Mucous membranes are moist.     Pharynx: Oropharynx is clear. Uvula midline. Posterior oropharyngeal erythema present. No pharyngeal swelling, oropharyngeal exudate or uvula swelling.  Eyes:     Extraocular Movements: EOM normal.  Cardiovascular:     Rate and Rhythm: Regular rhythm. Tachycardia present.  Pulmonary:     Effort: Pulmonary effort is normal. No respiratory distress.     Breath sounds: Normal breath sounds. No stridor. No wheezing, rhonchi or rales.  Musculoskeletal:        General: Normal range of motion.     Cervical back: Normal range of motion and neck supple. No tenderness.  Lymphadenopathy:     Cervical: No cervical adenopathy.  Skin:    General: Skin is warm and dry.  Neurological:     Mental Status: She is alert and oriented to person, place, and time.  Psychiatric:        Mood and Affect: Mood and affect normal.        Behavior: Behavior normal.      UC Treatments / Results  Labs (all labs ordered are listed, but only abnormal results are displayed) Labs Reviewed  POC SARS CORONAVIRUS 2 AG -  ED - Abnormal; Notable for the following components:      Result Value   SARS Coronavirus 2 Ag Positive (*)    All other components within normal limits    EKG   Radiology No results found.  Procedures Procedures (including critical care time)  Medications Ordered in UC Medications - No data to display  Initial Impression / Assessment and Plan / UC Course  I have reviewed the triage vital signs and the nursing notes.  Pertinent labs & imaging results that were available during my care of the patient were reviewed by me and considered in my medical decision making (see chart for details).     Rapid COVID: Positive Encouraged symptomatic tx F/u with PCP as needed Discussed symptoms that warrant emergent care in the ED. AVS and work note provided  Final Clinical Impressions(s) /  UC Diagnoses   Final diagnoses:  COVID     Discharge Instructions      You may take 500mg  acetaminophen every 4-6 hours or in combination  with ibuprofen 400-600mg  every 6-8 hours as needed for pain, inflammation, and fever.  Be sure to well hydrated with clear liquids and get at least 8 hours of sleep at night, preferably more while sick.   Please follow up with family medicine in 1 week if needed.     ED Prescriptions    None     PDMP not reviewed this encounter.   Lurene Shadow, [place] 01/24/21 [phone]

## 2021-03-31 IMAGING — DX DG CHEST 2V
2 series · 2 of 2 positions shown · non-contrast
Comparison: Chest radiograph dated 03/15/2019.

CLINICAL DATA: 44-year-old female with chest tightness

EXAM:
CHEST - 2 VIEW

[chest pa]
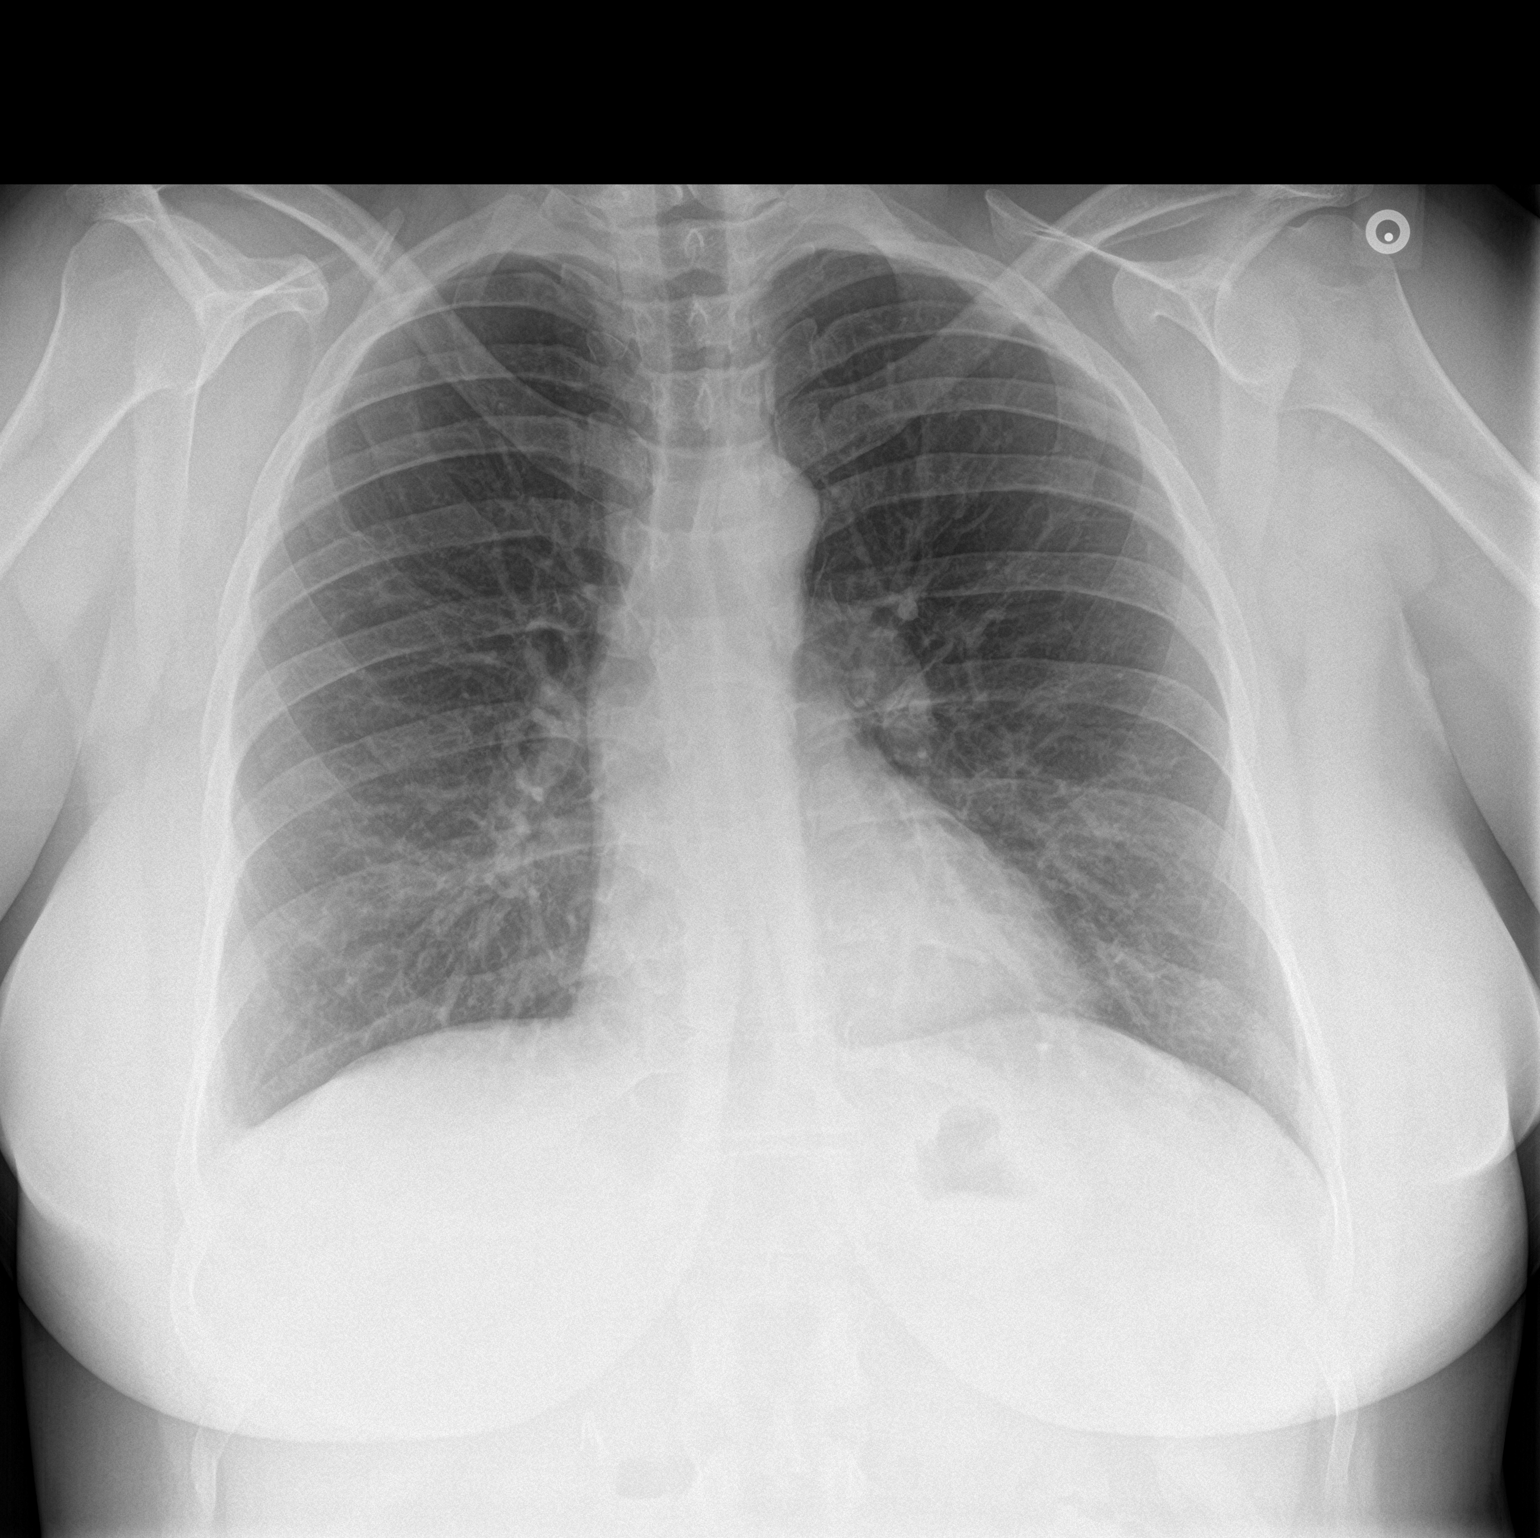

[chest lat]
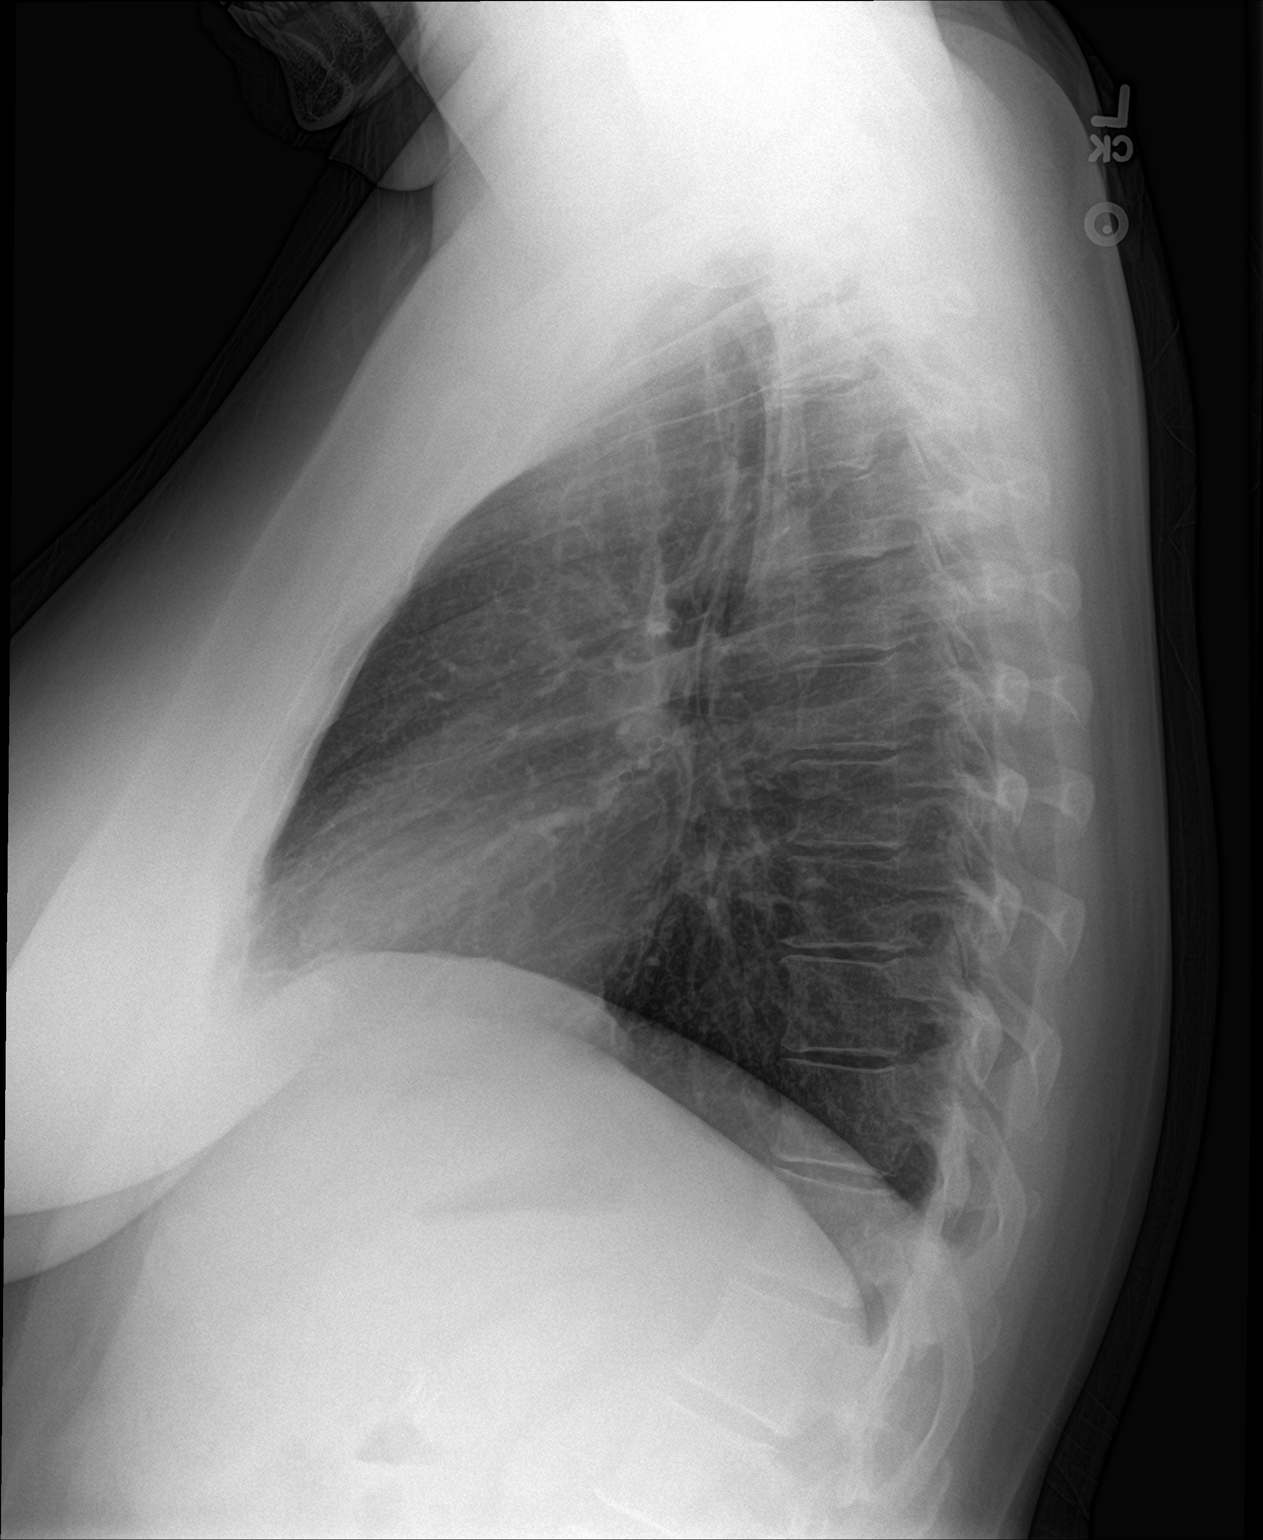

[2 of 2 positions shown; findings below may reference images not displayed]

FINDINGS: The heart size and mediastinal contours are within normal limits.
Both lungs are clear. The visualized skeletal structures are
unremarkable.
IMPRESSION: No active cardiopulmonary disease.

## 2022-10-09 ENCOUNTER — Encounter: Payer: Self-pay | Admitting: Emergency Medicine

## 2022-10-09 ENCOUNTER — Ambulatory Visit
Admission: EM | Admit: 2022-10-09 | Discharge: 2022-10-09 | Disposition: A | Discharge status: Home / Self Care | Payer: PRIVATE HEALTH INSURANCE

## 2022-10-09 DIAGNOSIS — J309 Allergic rhinitis, unspecified: Secondary | ICD-10-CM

## 2022-10-09 DIAGNOSIS — J0100 Acute maxillary sinusitis, unspecified: Secondary | ICD-10-CM

## 2022-10-09 DIAGNOSIS — R059 Cough, unspecified: Secondary | ICD-10-CM

## 2022-10-09 MED ORDER — AZITHROMYCIN 250 MG PO TABS: 250.0000 mg | ORAL_TABLET | Freq: Every day | ORAL | 0 refills | Status: DC

## 2022-10-09 MED ORDER — FEXOFENADINE HCL 180 MG PO TABS: 180.0000 mg | ORAL_TABLET | Freq: Every day | ORAL | 0 refills | Status: DC

## 2022-10-09 MED ORDER — BENZONATATE 200 MG PO CAPS: 200.0000 mg | ORAL_CAPSULE | Freq: Three times a day (TID) | ORAL | 0 refills | Status: AC | PRN

## 2022-10-09 NOTE — ED Triage Notes (Signed)
Sore throat & cough since Friday night Hoarse voice  Did not check temp at home  Sudafed at 0800 Negative home COVID test last night  COVID vaccine but no boosters  Body aches last night

## 2022-10-09 NOTE — ED Provider Notes (Signed)
[facility]    CSN: [id number] Arrival date & time: 10/09/22  1721      History   Chief Complaint Chief Complaint  Patient presents with   Sore Throat    HPI Kathleen Goodman is a 46 y.o. female.   HPI 46 year old female with presents with sore throat and cough for 5 days.  PMH significant for morbid obesity, cervical radiculopathy, and migraine.  Past Medical History:  Diagnosis Date   Fluid retention    Migraine     There are no problems to display for this patient.   Past Surgical History:  Procedure Laterality Date   ABDOMINAL HYSTERECTOMY      OB History   No obstetric history on file.      Home Medications    Prior to Admission medications   Medication Sig Start Date End Date Taking? Authorizing Provider  azithromycin (ZITHROMAX) 250 MG tablet Take 1 tablet (250 mg total) by mouth daily. Take first 2 tablets together, then 1 every day until finished. 10/09/22  Yes Trevor Iha, FNP  benzonatate (TESSALON) 200 MG capsule Take 1 capsule (200 mg total) by mouth 3 (three) times daily as needed for up to 7 days. 10/09/22 10/16/22 Yes Trevor Iha, FNP  fexofenadine [facility] ALLERGY) 180 MG tablet Take 1 tablet (180 mg total) by mouth daily for 15 days. 10/09/22 10/24/22 Yes Trevor Iha, FNP  furosemide (LASIX) 20 MG tablet Take 20 mg by mouth. Patient not taking: Reported on 10/09/2022    [provider]  hydrochlorothiazide (HYDRODIURIL) 25 MG tablet Take 25 mg by mouth daily. 08/21/22   [provider]  levothyroxine (SYNTHROID) 25 MCG tablet Take 25 mcg by mouth daily. 09/19/22   [provider]  SUMAtriptan (IMITREX) 100 MG tablet Take 100 mg by mouth every 2 (two) hours as needed for migraine. May repeat in 2 hours if headache persists or recurs.    [provider]  WEGOVY 1 MG/0.5ML SOAJ Inject into the skin. 08/26/22   [provider]  zolpidem (AMBIEN) 5 MG tablet Take 5 mg by mouth at bedtime  as needed for sleep.    [provider]    Family History Family History  Problem Relation Age of Onset   Hypertension Mother    Diabetes Mother    Hypertension Father    Diabetes Father     Social History Social History   Tobacco Use   Smoking status: Never   Smokeless tobacco: Never  Vaping Use   Vaping Use: Never used  Substance Use Topics   Alcohol use: No   Drug use: No     Allergies   Codeine   Review of Systems Review of Systems  Constitutional:  Positive for fever.  HENT:  Positive for sore throat.   Respiratory:  Positive for cough.      Physical Exam Triage Vital Signs ED Triage Vitals  Enc Vitals Group     BP      Pulse      Resp      Temp      Temp src      SpO2      Weight      Height      Head Circumference      Peak Flow      Pain Score      Pain Loc      Pain Edu?      Excl. in GC?    No data found.  Updated Vital Signs BP 112/81 (BP Location: Right Arm)   Pulse 95   Temp 98.7 F (37.1 C) (Oral)   Resp 16   Ht 5\' 6"  (1.676 m)   Wt 174 lb (78.9 kg)   SpO2 99%   BMI 28.08 kg/m   Physical Exam Vitals and nursing note reviewed.  Constitutional:      General: She is not in acute distress.    Appearance: Normal appearance. She is obese. She is ill-appearing.  HENT:     Head: Normocephalic and atraumatic.     Right Ear: Tympanic membrane and external ear normal.     Left Ear: Tympanic membrane and external ear normal.     Ears:     Comments: Moderate eustachian tube dysfunction noted bilaterally    Nose:     Comments: Turbinates are erythematous/edematous    Mouth/Throat:     Mouth: Mucous membranes are moist.     Pharynx: Oropharynx is clear.     Comments: Significant amount of clear drainage of posterior oropharynx noted Eyes:     Extraocular Movements: Extraocular movements intact.     Conjunctiva/sclera: Conjunctivae normal.     Pupils: Pupils are equal, round, and reactive to light.  Cardiovascular:      Rate and Rhythm: Normal rate and regular rhythm.     Pulses: Normal pulses.     Heart sounds: Normal heart sounds. No murmur heard. Pulmonary:     Effort: Pulmonary effort is normal.     Breath sounds: Normal breath sounds. No wheezing, rhonchi or rales.  Musculoskeletal:        General: Normal range of motion.     Cervical back: Normal range of motion and neck supple.  Skin:    General: Skin is warm and dry.  Neurological:     General: No focal deficit present.     Mental Status: She is alert and oriented to person, place, and time.      UC Treatments / Results  Labs (all labs ordered are listed, but only abnormal results are displayed) Labs Reviewed - No data to display  EKG   Radiology No results found.  Procedures Procedures (including critical care time)  Medications Ordered in UC Medications - No data to display  Initial Impression / Assessment and Plan / UC Course  I have reviewed the triage vital signs and the nursing notes.  Pertinent labs & imaging results that were available during my care of the patient were reviewed by me and considered in my medical decision making (see chart for details).     MDM: 1.  Subacute maxillary sinusitis-Rx'd Zithromax; 2.  Allergic rhinitis-Rx'd Allegra; 3.  Cough-Rx Tessalon. Advised patient to take medication as directed with food to completion.  Advised patient to take Allegra with Zithromax daily for the next 5 days.  Advised may use Allegra as needed afterwards for concurrent postnasal drainage/drip.  Advised may use Tessalon Perles daily or as needed for cough.  Encouraged patient increase daily water intake while taking this medication.  Advised patient if symptoms worsen and/or unresolved please follow-up with PCP or here for further evaluation.  Patient discharged home, hemodynamically stable. Final Clinical Impressions(s) / UC Diagnoses   Final diagnoses:  Subacute maxillary sinusitis  Allergic rhinitis, unspecified  seasonality, unspecified trigger  Cough, unspecified type     Discharge Instructions      Advised patient to take medication as directed with food to completion.  Advised patient to take Allegra with Zithromax daily  for the next 5 days.  Advised may use Allegra as needed afterwards for concurrent postnasal drainage/drip.  Advised may use Tessalon Perles daily or as needed for cough.  Encouraged patient increase daily water intake while taking this medication.  Advised patient if symptoms worsen and/or unresolved please follow-up with PCP or here for further evaluation.     ED Prescriptions     Medication Sig Dispense Auth. Provider   azithromycin (ZITHROMAX) 250 MG tablet Take 1 tablet (250 mg total) by mouth daily. Take first 2 tablets together, then 1 every day until finished. 6 tablet Trevor Iha, FNP   fexofenadine [facility] ALLERGY) 180 MG tablet Take 1 tablet (180 mg total) by mouth daily for 15 days. 15 tablet Trevor Iha, FNP   benzonatate (TESSALON) 200 MG capsule Take 1 capsule (200 mg total) by mouth 3 (three) times daily as needed for up to 7 days. 40 capsule Trevor Iha, FNP      PDMP not reviewed this encounter.   Trevor Iha, FNP 10/09/22 1755

## 2022-10-09 NOTE — Discharge Instructions (Addendum)
Advised patient to take medication as directed with food to completion.  Advised patient to take Allegra with Zithromax daily for the next 5 days.  Advised may use Allegra as needed afterwards for concurrent postnasal drainage/drip.  Advised may use Tessalon Perles daily or as needed for cough.  Encouraged patient increase daily water intake while taking this medication.  Advised patient if symptoms worsen and/or unresolved please follow-up with PCP or here for further evaluation.

## 2022-11-23 ENCOUNTER — Ambulatory Visit
Admission: EM | Admit: 2022-11-23 | Discharge: 2022-11-23 | Disposition: A | Discharge status: Home / Self Care | Payer: PRIVATE HEALTH INSURANCE | Attending: Family Medicine | Admitting: Family Medicine

## 2022-11-23 DIAGNOSIS — J988 Other specified respiratory disorders: Secondary | ICD-10-CM | POA: Diagnosis not present

## 2022-11-23 DIAGNOSIS — B9789 Other viral agents as the cause of diseases classified elsewhere: Secondary | ICD-10-CM

## 2022-11-23 MED ORDER — PREDNISONE 20 MG PO TABS: 40.0000 mg | ORAL_TABLET | Freq: Every day | ORAL | 0 refills | Status: DC

## 2022-11-23 NOTE — ED Triage Notes (Signed)
Pt reports her ears are uncomfortable, coughing, chest and throat are "burning". No flem is being released. Grandkids have rsv and she was in contact with them. Took otc cough meds but no relief.

## 2022-11-23 NOTE — ED Provider Notes (Signed)
[facility]    CSN: [id number] Arrival date & time: 11/23/22  1007      History   Chief Complaint No chief complaint on file.   HPI Kathleen Goodman is a 46 y.o. female.   HPI  Patient states she has an upper respiratory infection.  She has been exposed to 2 grandchildren with RSV.  She has a burning in her chest and a cough and tight sensation.  Some runny and stuffy nose and a burning in her nose and sinuses.  She does not have any mucus production.  She feels very tired.  Has had some sweats or chills but has not taken her temperature.  Past Medical History:  Diagnosis Date   Fluid retention    Migraine     There are no problems to display for this patient.   Past Surgical History:  Procedure Laterality Date   ABDOMINAL HYSTERECTOMY      OB History   No obstetric history on file.      Home Medications    Prior to Admission medications   Medication Sig Start Date End Date Taking? Authorizing Provider  predniSONE (DELTASONE) 20 MG tablet Take 2 tablets (40 mg total) by mouth daily with breakfast. 11/23/22  Yes Eustace Moore, MD  hydrochlorothiazide (HYDRODIURIL) 25 MG tablet Take 25 mg by mouth daily. 08/21/22   [provider]  levothyroxine (SYNTHROID) 25 MCG tablet Take 25 mcg by mouth daily. 09/19/22   [provider]  SUMAtriptan (IMITREX) 100 MG tablet Take 100 mg by mouth every 2 (two) hours as needed for migraine. May repeat in 2 hours if headache persists or recurs.    [provider]  WEGOVY 1 MG/0.5ML SOAJ Inject into the skin. 08/26/22   [provider]  zolpidem (AMBIEN) 5 MG tablet Take 5 mg by mouth at bedtime as needed for sleep.    [provider]    Family History Family History  Problem Relation Age of Onset   Hypertension Mother    Diabetes Mother    Hypertension Father    Diabetes Father     Social History Social History   Tobacco Use   Smoking status: Never   Smokeless  tobacco: Never  Vaping Use   Vaping Use: Never used  Substance Use Topics   Alcohol use: No   Drug use: No     Allergies   Codeine   Review of Systems Review of Systems See HPI  Physical Exam Triage Vital Signs ED Triage Vitals  Enc Vitals Group     BP 11/23/22 1040 107/76     Pulse Rate 11/23/22 1040 98     Resp 11/23/22 1040 18     Temp 11/23/22 1040 98 F (36.7 C)     Temp Source 11/23/22 1040 Oral     SpO2 11/23/22 1040 99 %     Weight 11/23/22 1045 158 lb (71.7 kg)     Height --      Head Circumference --      Peak Flow --      Pain Score 11/23/22 1045 0     Pain Loc --      Pain Edu? --      Excl. in GC? --    No data found.  Updated Vital Signs BP 107/76 (BP Location: Right Arm)   Pulse 98   Temp 98 F (36.7 C) (Oral)   Resp 18   Wt 71.7 kg   SpO2 99%  BMI 25.50 kg/m      Physical Exam Constitutional:      General: She is not in acute distress.    Appearance: She is well-developed. She is ill-appearing.  HENT:     Head: Normocephalic and atraumatic.  Eyes:     Conjunctiva/sclera: Conjunctivae normal.     Pupils: Pupils are equal, round, and reactive to light.  Cardiovascular:     Rate and Rhythm: Normal rate.  Pulmonary:     Effort: Pulmonary effort is normal. No respiratory distress.     Breath sounds: No wheezing or rhonchi.  Abdominal:     General: There is no distension.     Palpations: Abdomen is soft.  Musculoskeletal:        General: Normal range of motion.     Cervical back: Normal range of motion.  Skin:    General: Skin is warm and dry.  Neurological:     Mental Status: She is alert.      UC Treatments / Results  Labs (all labs ordered are listed, but only abnormal results are displayed) Labs Reviewed - No data to display  EKG   Radiology No results found.  Procedures Procedures (including critical care time)  Medications Ordered in UC Medications - No data to display  Initial Impression / Assessment and  Plan / UC Course  I have reviewed the triage vital signs and the nursing notes.  Pertinent labs & imaging results that were available during my care of the patient were reviewed by me and considered in my medical decision making (see chart for details).     I explained that this is a viral infection.  Symptomatic care is appropriate.  Patient states that she usually feels much better if she takes prednisone for a few days.  Explained to her that I do not have a real indication for that (wheezing) but will offer her a prescription to see if it helps. Final Clinical Impressions(s) / UC Diagnoses   Final diagnoses:  None     Discharge Instructions      Take prednisone once a day for 5 days with food Drink lots of water May continue over-the-counter cough and cold medicine as needed See your doctor if not improving by next week   ED Prescriptions     Medication Sig Dispense Auth. Provider   predniSONE (DELTASONE) 20 MG tablet Take 2 tablets (40 mg total) by mouth daily with breakfast. 10 tablet Eustace Moore, MD      PDMP not reviewed this encounter.   Eustace Moore, MD 11/23/22 [phone]

## 2022-11-23 NOTE — Discharge Instructions (Signed)
Take prednisone once a day for 5 days with food Drink lots of water May continue over-the-counter cough and cold medicine as needed See your doctor if not improving by next week

## 2023-01-29 ENCOUNTER — Encounter: Payer: Self-pay | Admitting: Emergency Medicine

## 2023-01-29 ENCOUNTER — Ambulatory Visit
Admission: EM | Admit: 2023-01-29 | Discharge: 2023-01-29 | Disposition: A | Discharge status: Home / Self Care | Payer: PRIVATE HEALTH INSURANCE | Attending: Urgent Care | Admitting: Urgent Care

## 2023-01-29 DIAGNOSIS — U071 COVID-19: Secondary | ICD-10-CM | POA: Diagnosis not present

## 2023-01-29 DIAGNOSIS — J208 Acute bronchitis due to other specified organisms: Secondary | ICD-10-CM | POA: Diagnosis not present

## 2023-01-29 DIAGNOSIS — R0981 Nasal congestion: Secondary | ICD-10-CM | POA: Diagnosis not present

## 2023-01-29 LAB — POC SARS CORONAVIRUS 2 AG -  ED: SARS Coronavirus 2 Ag: POSITIVE — AB

## 2023-01-29 MED ORDER — PAXLOVID (300/100) 20 X 150 MG & 10 X 100MG PO TBPK: 3.0000 | ORAL_TABLET | Freq: Two times a day (BID) | ORAL | 0 refills | Status: AC

## 2023-01-29 MED ORDER — ALBUTEROL SULFATE HFA 108 (90 BASE) MCG/ACT IN AERS: 1.0000 | INHALATION_SPRAY | Freq: Four times a day (QID) | RESPIRATORY_TRACT | 0 refills | Status: DC | PRN

## 2023-01-29 MED ORDER — GUAIFENESIN ER 600 MG PO TB12: 600.0000 mg | ORAL_TABLET | Freq: Two times a day (BID) | ORAL | 0 refills | Status: DC | PRN

## 2023-01-29 MED ORDER — FLUTICASONE PROPIONATE 50 MCG/ACT NA SUSP: 1.0000 | Freq: Every day | NASAL | 0 refills | Status: DC

## 2023-01-29 NOTE — ED Provider Notes (Signed)
[facility]    CSN: [id number] Arrival date & time: 01/29/23  1454      History   Chief Complaint Chief Complaint  Patient presents with   Otalgia    HPI Kathleen Goodman is a 47 y.o. female.   47 year old female presents today due to concerns of nasal congestion, ear pain, cough, body aches, fever and sore throat. Did not check temp at home, but states she was "burning up." Denies any chronic pulmonary issues, but states she has been coughing up mucous/ phlegm. Sx started 2 days ago. Husband also sick. Has tried numerous OTC medications with worsening symptoms.   Otalgia  Past Medical History:  Diagnosis Date   Fluid retention    Migraine     There are no problems to display for this patient.   Past Surgical History:  Procedure Laterality Date   ABDOMINAL HYSTERECTOMY      OB History   No obstetric history on file.      Home Medications    Prior to Admission medications   Medication Sig Start Date End Date Taking? Authorizing Provider  albuterol (VENTOLIN HFA) 108 (90 Base) MCG/ACT inhaler Inhale 1-2 puffs into the lungs every 6 (six) hours as needed for wheezing or shortness of breath. 01/29/23  Yes Mahnoor Mathisen L, PA  fluticasone (FLONASE) 50 MCG/ACT nasal spray Place 1 spray into both nostrils daily. 01/29/23  Yes Leomar Westberg L, PA  guaiFENesin (MUCINEX) 600 MG 12 hr tablet Take 1 tablet (600 mg total) by mouth 2 (two) times daily as needed for cough or to loosen phlegm. 01/29/23  Yes Brode Sculley L, PA  hydrochlorothiazide (HYDRODIURIL) 25 MG tablet Take 25 mg by mouth daily. 08/21/22  Yes [provider]  levothyroxine (SYNTHROID) 25 MCG tablet Take 25 mcg by mouth daily. 09/19/22  Yes [provider]  nirmatrelvir & ritonavir (PAXLOVID, 300/100,) 20 x 150 MG & 10 x 100MG  TBPK Take 3 tablets by mouth 2 (two) times daily for 5 days. 01/29/23 02/03/23 Yes Frimet Durfee L, PA  SUMAtriptan (IMITREX) 100 MG tablet Take 100 mg by mouth  every 2 (two) hours as needed for migraine. May repeat in 2 hours if headache persists or recurs.   Yes [provider]  WEGOVY 1 MG/0.5ML SOAJ Inject into the skin. 08/26/22  Yes [provider]  zolpidem (AMBIEN) 5 MG tablet Take 5 mg by mouth at bedtime as needed for sleep.   Yes [provider]    Family History Family History  Problem Relation Age of Onset   Hypertension Mother    Diabetes Mother    Hypertension Father    Diabetes Father     Social History Social History   Tobacco Use   Smoking status: Never   Smokeless tobacco: Never  Vaping Use   Vaping Use: Never used  Substance Use Topics   Alcohol use: No   Drug use: No     Allergies   Codeine   Review of Systems Review of Systems  HENT:  Positive for ear pain.   As per HPI   Physical Exam Triage Vital Signs ED Triage Vitals  Enc Vitals Group     BP 01/29/23 1507 115/88     Pulse Rate 01/29/23 1507 (!) 101     Resp 01/29/23 1507 18     Temp 01/29/23 1507 98.9 F (37.2 C)     Temp Source 01/29/23 1507 Oral     SpO2 01/29/23 1507 98 %  Weight 01/29/23 1509 155 lb (70.3 kg)     Height 01/29/23 1509 5\' 6"  (1.676 m)     Head Circumference --      Peak Flow --      Pain Score 01/29/23 1508 0     Pain Loc --      Pain Edu? --      Excl. in [place]? --    No data found.  Updated Vital Signs BP 115/88 (BP Location: Right Arm)   Pulse (!) 101   Temp 98.9 F (37.2 C) (Oral)   Resp 18   Ht 5\' 6"  (1.676 m)   Wt 155 lb (70.3 kg)   SpO2 98%   BMI 25.02 kg/m   Visual Acuity Right Eye Distance:   Left Eye Distance:   Bilateral Distance:    Right Eye Near:   Left Eye Near:    Bilateral Near:     Physical Exam Vitals and nursing note reviewed.  Constitutional:      General: She is not in acute distress.    Appearance: She is well-developed. She is ill-appearing. She is not toxic-appearing.  HENT:     Head: Normocephalic and atraumatic.     Right Ear: Ear canal and  external ear normal. A middle ear effusion is present. There is no impacted cerumen. Tympanic membrane is injected. Tympanic membrane is not erythematous, retracted or bulging.     Left Ear: Ear canal and external ear normal. A middle ear effusion is present. There is no impacted cerumen. Tympanic membrane is injected. Tympanic membrane is not erythematous, retracted or bulging.     Nose: Congestion and rhinorrhea present. Rhinorrhea is clear.     Mouth/Throat:     Lips: Pink.     Mouth: Mucous membranes are moist. No oral lesions.     Pharynx: Oropharynx is clear. Uvula midline. No pharyngeal swelling, oropharyngeal exudate, posterior oropharyngeal erythema or uvula swelling.  Eyes:     General: No scleral icterus.       Right eye: No discharge.        Left eye: No discharge.     Extraocular Movements: Extraocular movements intact.     Conjunctiva/sclera: Conjunctivae normal.     Pupils: Pupils are equal, round, and reactive to light.  Cardiovascular:     Rate and Rhythm: Regular rhythm. Tachycardia present.     Heart sounds: No murmur heard. Pulmonary:     Effort: Pulmonary effort is normal. No accessory muscle usage, respiratory distress or retractions.     Breath sounds: Normal air entry. No stridor, decreased air movement or transmitted upper airway sounds. Wheezing and rhonchi present. No decreased breath sounds or rales.  Abdominal:     Palpations: Abdomen is soft.     Tenderness: There is no abdominal tenderness.  Musculoskeletal:        General: No swelling.     Cervical back: Normal range of motion and neck supple. No rigidity or tenderness.  Lymphadenopathy:     Cervical: No cervical adenopathy.  Skin:    General: Skin is warm and dry.     Capillary Refill: Capillary refill takes less than 2 seconds.     Findings: No bruising, erythema or rash.  Neurological:     General: No focal deficit present.     Mental Status: She is alert and oriented to person, place, and time.   Psychiatric:        Mood and Affect: Mood normal.  UC Treatments / Results  Labs (all labs ordered are listed, but only abnormal results are displayed) Labs Reviewed  POC SARS CORONAVIRUS 2 AG -  ED - Abnormal; Notable for the following components:      Result Value   SARS Coronavirus 2 Ag Positive (*)    All other components within normal limits    EKG   Radiology No results found.  Procedures Procedures (including critical care time)  Medications Ordered in UC Medications - No data to display  Initial Impression / Assessment and Plan / UC Course  I have reviewed the triage vital signs and the nursing notes.  Pertinent labs & imaging results that were available during my care of the patient were reviewed by me and considered in my medical decision making (see chart for details).     Covid-19 -patient on the third day of COVID-19 symptoms.  No personal history of renal disorder, last GFR within normal limits.  Discussed antiviral therapy, patient requesting to proceed.  Will start Paxlovid twice daily for 5 days.  Patient also to use Mucinex and Flonase to help with her significant nasal congestion. Acute bronchitis -secondary to #1.  Recommended Mucinex to help break up the phlegm.  Albuterol inhaler prescribed to help with the wheezing and congestion Nasal congestion -treatment as above   Final Clinical Impressions(s) / UC Diagnoses   Final diagnoses:  COVID-19  Acute bronchitis due to other specified organisms  Nasal congestion     Discharge Instructions      You are positive for COVID-19. Please isolate at home for a total of 5 days. The attached handouts regarding COVID and paxlovid. Monitor for side effects to include nausea or diarrhea.  Start taking the Paxlovid twice daily for 5 days. You will only take your evening dose today. Take Mucinex twice daily with increased water to help break up the mucus. Use Flonase to help with your nasal  congestion.  Take 1 to 2 puffs of albuterol every 6 hours as needed to help with your cough and wheezing.  If you develop any severe shortness of breath, chest pain, or uncontrolled fever, had to the emergency room.      ED Prescriptions     Medication Sig Dispense Auth. Provider   nirmatrelvir & ritonavir (PAXLOVID, 300/100,) 20 x 150 MG & 10 x 100MG  TBPK Take 3 tablets by mouth 2 (two) times daily for 5 days. 30 tablet Zawadi Aplin L, PA   guaiFENesin (MUCINEX) 600 MG 12 hr tablet Take 1 tablet (600 mg total) by mouth 2 (two) times daily as needed for cough or to loosen phlegm. 20 tablet Jenine Krisher L, PA   fluticasone (FLONASE) 50 MCG/ACT nasal spray Place 1 spray into both nostrils daily. 16 mL Ilias Stcharles L, PA   albuterol (VENTOLIN HFA) 108 (90 Base) MCG/ACT inhaler Inhale 1-2 puffs into the lungs every 6 (six) hours as needed for wheezing or shortness of breath. 8 g Sameeha Rockefeller L, [place]      PDMP not reviewed this encounter.   Chaney Malling, [place] 01/29/23 [phone]

## 2023-01-29 NOTE — ED Triage Notes (Signed)
Patient c/o bilateral ear pain, head and nasal congestion, sore throat, body aches, fever x 2 days.  Patient has taken Sudafed, Thera-flu, Tylenol Sinus Congestion, Mucinex.

## 2023-01-29 NOTE — Discharge Instructions (Addendum)
You are positive for COVID-19. Please isolate at home for a total of 5 days. The attached handouts regarding COVID and paxlovid. Monitor for side effects to include nausea or diarrhea.  Start taking the Paxlovid twice daily for 5 days. You will only take your evening dose today. Take Mucinex twice daily with increased water to help break up the mucus. Use Flonase to help with your nasal congestion.  Take 1 to 2 puffs of albuterol every 6 hours as needed to help with your cough and wheezing.  If you develop any severe shortness of breath, chest pain, or uncontrolled fever, had to the emergency room.

## 2023-01-30 ENCOUNTER — Telehealth: Payer: Self-pay

## 2023-01-30 NOTE — Telephone Encounter (Signed)
Pt states she's still not feeling great. Has started meds. Advised to call back if any questions or concerns.

## 2024-08-09 ENCOUNTER — Ambulatory Visit
Admission: EM | Admit: 2024-08-09 | Discharge: 2024-08-09 | Disposition: A | Discharge status: Home / Self Care | Payer: PRIVATE HEALTH INSURANCE | Attending: Internal Medicine | Admitting: Internal Medicine

## 2024-08-09 DIAGNOSIS — J069 Acute upper respiratory infection, unspecified: Secondary | ICD-10-CM | POA: Diagnosis not present

## 2024-08-09 LAB — POC SARS CORONAVIRUS 2 AG -  ED: SARS Coronavirus 2 Ag: NEGATIVE

## 2024-08-09 MED ORDER — BENZONATATE 100 MG PO CAPS: 100.0000 mg | ORAL_CAPSULE | Freq: Three times a day (TID) | ORAL | 0 refills | Status: DC

## 2024-08-09 MED ORDER — PROMETHAZINE-DM 6.25-15 MG/5ML PO SYRP: 5.0000 mL | ORAL_SOLUTION | Freq: Every evening | ORAL | 0 refills | Status: DC | PRN

## 2024-08-09 NOTE — ED Triage Notes (Signed)
 Pt c/o dry cough, facial pain and burning chest congestion since Friday. No known fever. Taking Alkaseltzer and nyquil prn

## 2024-08-09 NOTE — Discharge Instructions (Signed)
 You have a viral illness which will improve on its own with rest, fluids, and medications to help with your symptoms.  Alkaseltzer and saline nasal sprays may help relieve symptoms.   Tessalon  perles every 8 hours as needed for cough.  Promethazine  DM cough syrup at bedtime as needed, this will make you sleepy so only take at bedtime.   Two teaspoons of honey in 1 cup of warm water every 4-6 hours may help with throat pains.  Humidifier in room at nighttime may help soothe cough (clean well daily).   For chest pain, shortness of breath, inability to keep food or fluids down without vomiting, fever that does not respond to tylenol or motrin, or any other severe symptoms, please go to the ER for further evaluation. Return to urgent care as needed, otherwise follow-up with PCP.

## 2024-08-09 NOTE — ED Provider Notes (Signed)
 [facility]    CSN: [id number] Arrival date & time: 08/09/24  9185      History   Chief Complaint Chief Complaint  Patient presents with   Cough    W/ chest congestion   Facial Pain    HPI Kathleen Goodman is a 48 y.o. female.   Kathleen Goodman is a 48 y.o. female presenting for chief complaint of acute cough, congestion, itchy throat, and generalized fatigue that started 3 days ago on Friday, August 06, 2024.  Cough is dry, nonproductive, and worse at nighttime.  She feels very fatigued.  No nausea, vomiting, diarrhea, abdominal pain, rash, fever, chills, or dizziness.  No recent sick contacts with similar symptoms.  Never smoker, denies illicit drug use.  Denies history of chronic respiratory problems like asthma/COPD.  Denies shortness of breath, chest pain, palpitations, orthopnea, and leg swelling.  Taking Alka-Seltzer plus and NyQuil with minimal relief.   Cough   Past Medical History:  Diagnosis Date   Fluid retention    Migraine     There are no active problems to display for this patient.   Past Surgical History:  Procedure Laterality Date   ABDOMINAL HYSTERECTOMY      OB History   No obstetric history on file.      Home Medications    Prior to Admission medications   Medication Sig Start Date End Date Taking? Authorizing Provider  benzonatate  (TESSALON ) 100 MG capsule Take 1 capsule (100 mg total) by mouth every 8 (eight) hours. 08/09/24  Yes Enedelia Dorna HERO, FNP  promethazine -dextromethorphan (PROMETHAZINE -DM) 6.25-15 MG/5ML syrup Take 5 mLs by mouth at bedtime as needed for cough. 08/09/24  Yes Enedelia Dorna HERO, FNP  albuterol  (VENTOLIN  HFA) 108 (90 Base) MCG/ACT inhaler Inhale 1-2 puffs into the lungs every 6 (six) hours as needed for wheezing or shortness of breath. 01/29/23   Crain, Whitney L, PA  fluticasone  (FLONASE ) 50 MCG/ACT nasal spray Place 1 spray into both nostrils daily. 01/29/23   Crain, Whitney L, PA  guaiFENesin   (MUCINEX ) 600 MG 12 hr tablet Take 1 tablet (600 mg total) by mouth 2 (two) times daily as needed for cough or to loosen phlegm. 01/29/23   Crain, Whitney L, PA  hydrochlorothiazide (HYDRODIURIL) 25 MG tablet Take 25 mg by mouth daily. 08/21/22   [provider]  levothyroxine (SYNTHROID) 25 MCG tablet Take 25 mcg by mouth daily. 09/19/22   [provider]  SUMAtriptan (IMITREX) 100 MG tablet Take 100 mg by mouth every 2 (two) hours as needed for migraine. May repeat in 2 hours if headache persists or recurs.    [provider]  WEGOVY 1 MG/0.5ML SOAJ Inject into the skin. 08/26/22   [provider]  zolpidem (AMBIEN) 5 MG tablet Take 5 mg by mouth at bedtime as needed for sleep.    [provider]    Family History Family History  Problem Relation Age of Onset   Hypertension Mother    Diabetes Mother    Hypertension Father    Diabetes Father     Social History Social History   Tobacco Use   Smoking status: Never   Smokeless tobacco: Never  Vaping Use   Vaping status: Never Used  Substance Use Topics   Alcohol use: No   Drug use: No     Allergies   Codeine   Review of Systems Review of Systems  Respiratory:  Positive for cough.   Per HPI   Physical Exam Triage  Vital Signs ED Triage Vitals [08/09/24 0824]  Encounter Vitals Group     BP 112/78     Girls Systolic BP Percentile      Girls Diastolic BP Percentile      Boys Systolic BP Percentile      Boys Diastolic BP Percentile      Pulse Rate 100     Resp 17     Temp 97.7 F (36.5 C)     Temp Source Oral     SpO2 98 %     Weight      Height      Head Circumference      Peak Flow      Pain Score 0     Pain Loc      Pain Education      Exclude from Growth Chart    No data found.  Updated Vital Signs BP 112/78 (BP Location: Right Arm)   Pulse 100   Temp 97.7 F (36.5 C) (Oral)   Resp 17   SpO2 98%   Visual Acuity Right Eye Distance:   Left Eye Distance:    Bilateral Distance:    Right Eye Near:   Left Eye Near:    Bilateral Near:     Physical Exam Vitals and nursing note reviewed.  Constitutional:      Appearance: She is not ill-appearing or toxic-appearing.  HENT:     Head: Normocephalic and atraumatic.     Right Ear: Hearing, tympanic membrane, ear canal and external ear normal.     Left Ear: Hearing, tympanic membrane, ear canal and external ear normal.     Nose: Nose normal.     Mouth/Throat:     Lips: Pink.     Mouth: Mucous membranes are moist. No injury or oral lesions.     Dentition: Normal dentition.     Tongue: No lesions.     Pharynx: Oropharynx is clear. Uvula midline. No pharyngeal swelling, oropharyngeal exudate, posterior oropharyngeal erythema, uvula swelling or postnasal drip.     Tonsils: No tonsillar exudate.  Eyes:     General: Lids are normal. Vision grossly intact. Gaze aligned appropriately.     Extraocular Movements: Extraocular movements intact.     Conjunctiva/sclera: Conjunctivae normal.  Neck:     Trachea: Trachea and phonation normal.  Cardiovascular:     Rate and Rhythm: Normal rate and regular rhythm.     Heart sounds: Normal heart sounds, S1 normal and S2 normal.  Pulmonary:     Effort: Pulmonary effort is normal. No respiratory distress.     Breath sounds: Normal breath sounds and air entry. No wheezing, rhonchi or rales.     Comments: Speaking in full sentences without difficulty or increased respiratory effort.  Chest:     Chest wall: No tenderness.  Musculoskeletal:     Cervical back: Neck supple.  Lymphadenopathy:     Cervical: No cervical adenopathy.  Skin:    General: Skin is warm and dry.     Capillary Refill: Capillary refill takes less than 2 seconds.     Findings: No rash.  Neurological:     General: No focal deficit present.     Mental Status: She is alert and oriented to person, place, and time. Mental status is at baseline.     Cranial Nerves: No dysarthria or facial  asymmetry.  Psychiatric:        Mood and Affect: Mood normal.        Speech: Speech  normal.        Behavior: Behavior normal.        Thought Content: Thought content normal.        Judgment: Judgment normal.      UC Treatments / Results  Labs (all labs ordered are listed, but only abnormal results are displayed) Labs Reviewed  POC SARS CORONAVIRUS 2 AG -  ED    EKG   Radiology No results found.  Procedures Procedures (including critical care time)  Medications Ordered in UC Medications - No data to display  Initial Impression / Assessment and Plan / UC Course  I have reviewed the triage vital signs and the nursing notes.  Pertinent labs & imaging results that were available during my care of the patient were reviewed by me and considered in my medical decision making (see chart for details).   1.  Viral URI with cough Suspect viral URI, viral syndrome.  Strep/viral testing: POC COVID-19 testing negative.   Physical exam findings reassuring, vital signs hemodynamically stable, and lungs clear, therefore deferred imaging of the chest.  Advised supportive care/prescriptions for symptomatic relief as outlined in AVS.    Counseled patient on potential for adverse effects with medications prescribed/recommended today, strict ER and return-to-clinic precautions discussed, patient verbalized understanding.    Final Clinical Impressions(s) / UC Diagnoses   Final diagnoses:  Viral URI with cough     Discharge Instructions      You have a viral illness which will improve on its own with rest, fluids, and medications to help with your symptoms.  Alkaseltzer and saline nasal sprays may help relieve symptoms.   Tessalon  perles every 8 hours as needed for cough.  Promethazine  DM cough syrup at bedtime as needed, this will make you sleepy so only take at bedtime.   Two teaspoons of honey in 1 cup of warm water every 4-6 hours may help with throat pains.  Humidifier in  room at nighttime may help soothe cough (clean well daily).   For chest pain, shortness of breath, inability to keep food or fluids down without vomiting, fever that does not respond to tylenol or motrin, or any other severe symptoms, please go to the ER for further evaluation. Return to urgent care as needed, otherwise follow-up with PCP.       ED Prescriptions     Medication Sig Dispense Auth. Provider   benzonatate  (TESSALON ) 100 MG capsule Take 1 capsule (100 mg total) by mouth every 8 (eight) hours. 21 capsule Enedelia Dorna HERO, FNP   promethazine -dextromethorphan (PROMETHAZINE -DM) 6.25-15 MG/5ML syrup Take 5 mLs by mouth at bedtime as needed for cough. 118 mL Enedelia Dorna HERO, FNP      PDMP not reviewed this encounter.   Enedelia Dorna HERO, [place] 08/09/24 [phone]

## 2024-11-30 ENCOUNTER — Ambulatory Visit
Admission: EM | Admit: 2024-11-30 | Discharge: 2024-11-30 | Disposition: A | Discharge status: Home / Self Care | Payer: PRIVATE HEALTH INSURANCE | Attending: Family Medicine | Admitting: Family Medicine

## 2024-11-30 DIAGNOSIS — J019 Acute sinusitis, unspecified: Secondary | ICD-10-CM | POA: Diagnosis not present

## 2024-11-30 DIAGNOSIS — B9689 Other specified bacterial agents as the cause of diseases classified elsewhere: Secondary | ICD-10-CM | POA: Diagnosis not present

## 2024-11-30 MED ORDER — PREDNISONE 20 MG PO TABS: 40.0000 mg | ORAL_TABLET | Freq: Every day | ORAL | 0 refills | Status: AC

## 2024-11-30 MED ORDER — AMOXICILLIN-POT CLAVULANATE 875-125 MG PO TABS: 1.0000 | ORAL_TABLET | Freq: Two times a day (BID) | ORAL | 0 refills | Status: AC

## 2024-11-30 NOTE — ED Provider Notes (Signed)
 [facility]    CSN: [id number] Arrival date & time: 11/30/24  0900      History   Chief Complaint Chief Complaint  Patient presents with   Cough   Nasal Congestion   Otalgia    HPI Kathleen Goodman is a 48 y.o. female.   HPI Patient has had symptoms for over a week.  She has cough congestion postnasal drip sinus drainage runny nose.  Nasal membranes are sore and red.  Mild sore throat.  Hoarse voice.  Has tried over-the-counter medicines.  No fever or chills.  No headache or body ache Past Medical History:  Diagnosis Date   Fluid retention    Migraine     There are no active problems to display for this patient.   Past Surgical History:  Procedure Laterality Date   ABDOMINAL HYSTERECTOMY      OB History   No obstetric history on file.      Home Medications    Prior to Admission medications   Medication Sig Start Date End Date Taking? Authorizing Provider  amoxicillin-clavulanate (AUGMENTIN) 875-125 MG tablet Take 1 tablet by mouth every 12 (twelve) hours. 11/30/24  Yes Maranda Jamee Jacob, MD  predniSONE  (DELTASONE ) 20 MG tablet Take 2 tablets (40 mg total) by mouth daily with breakfast. 11/30/24  Yes Maranda Jamee Jacob, MD  hydrochlorothiazide (HYDRODIURIL) 25 MG tablet Take 25 mg by mouth daily. 08/21/22   [provider]  levothyroxine (SYNTHROID) 25 MCG tablet Take 25 mcg by mouth daily. 09/19/22   [provider]  SUMAtriptan (IMITREX) 100 MG tablet Take 100 mg by mouth every 2 (two) hours as needed for migraine. May repeat in 2 hours if headache persists or recurs.    [provider]  WEGOVY 1 MG/0.5ML SOAJ Inject into the skin. 08/26/22   [provider]  zolpidem (AMBIEN) 5 MG tablet Take 5 mg by mouth at bedtime as needed for sleep.    [provider]    Family History Family History  Problem Relation Age of Onset   Hypertension Mother    Diabetes Mother    Hypertension Father    Diabetes Father      Social History Social History   Tobacco Use   Smoking status: Never   Smokeless tobacco: Never  Vaping Use   Vaping status: Never Used  Substance Use Topics   Alcohol use: No   Drug use: No     Allergies   Codeine   Review of Systems Review of Systems  See HPI Physical Exam Triage Vital Signs ED Triage Vitals [11/30/24 0910]  Encounter Vitals Group     BP 112/78     Girls Systolic BP Percentile      Girls Diastolic BP Percentile      Boys Systolic BP Percentile      Boys Diastolic BP Percentile      Pulse Rate 100     Resp 17     Temp 98 F (36.7 C)     Temp Source Oral     SpO2 98 %     Weight      Height      Head Circumference      Peak Flow      Pain Score 6     Pain Loc      Pain Education      Exclude from Growth Chart    No data found.  Updated Vital Signs BP 112/78 (BP Location: Right Arm)  Pulse 100   Temp 98 F (36.7 C) (Oral)   Resp 17   SpO2 98%   Physical Exam Constitutional:      General: She is not in acute distress.    Appearance: She is well-developed and normal weight. She is ill-appearing.  HENT:     Head: Normocephalic and atraumatic.     Right Ear: Tympanic membrane normal.     Nose: Congestion and rhinorrhea present.     Comments: Skin around nares is crusted and red.  Nasal membranes swollen and erythematous    Mouth/Throat:     Pharynx: Posterior oropharyngeal erythema present.     Comments: Facial sinuses are tender Eyes:     Conjunctiva/sclera: Conjunctivae normal.     Pupils: Pupils are equal, round, and reactive to light.  Cardiovascular:     Rate and Rhythm: Normal rate.     Heart sounds: Normal heart sounds.  Pulmonary:     Effort: Pulmonary effort is normal. No respiratory distress.     Breath sounds: Normal breath sounds.  Musculoskeletal:        General: Normal range of motion.     Cervical back: Normal range of motion.  Lymphadenopathy:     Cervical: Cervical adenopathy present.  Skin:     General: Skin is warm and dry.  Neurological:     Mental Status: She is alert.      UC Treatments / Results  Labs (all labs ordered are listed, but only abnormal results are displayed) Labs Reviewed - No data to display  EKG   Radiology No results found.  Procedures Procedures (including critical care time)  Medications Ordered in UC Medications - No data to display  Initial Impression / Assessment and Plan / UC Course  I have reviewed the triage vital signs and the nursing notes.  Pertinent labs & imaging results that were available during my care of the patient were reviewed by me and considered in my medical decision making (see chart for details).     Final Clinical Impressions(s) / UC Diagnoses   Final diagnoses:  Acute bacterial sinusitis     Discharge Instructions      Drink lots of fluids Take the Augmentin antibiotic 2 times a day.  Take 2 doses today Take prednisone  once a day for 5 days May continue Mucinex  or Mucinex  DM for your symptoms See your doctor if not improving by next week   ED Prescriptions     Medication Sig Dispense Auth. Provider   amoxicillin-clavulanate (AUGMENTIN) 875-125 MG tablet Take 1 tablet by mouth every 12 (twelve) hours. 14 tablet Maranda Jamee Jacob, MD   predniSONE  (DELTASONE ) 20 MG tablet Take 2 tablets (40 mg total) by mouth daily with breakfast. 10 tablet Maranda Jamee Jacob, MD      PDMP not reviewed this encounter.   Maranda Jamee Jacob, MD 11/30/24 [phone]

## 2024-11-30 NOTE — Discharge Instructions (Signed)
 Drink lots of fluids Take the Augmentin antibiotic 2 times a day.  Take 2 doses today Take prednisone  once a day for 5 days May continue Mucinex  or Mucinex  DM for your symptoms See your doctor if not improving by next week

## 2024-11-30 NOTE — ED Triage Notes (Signed)
 Pt c/o cough, nasal congestion/facial pain and bilateral ear pressure x 1 week. Denies fever. Mucinex  and theraflu prn.

## 2024-12-01 ENCOUNTER — Telehealth: Payer: Self-pay

## 2024-12-01 NOTE — Telephone Encounter (Signed)
Called to check on patient. No answer. Left voicemail.
